# Patient Record
Sex: Female | Born: 1995 | Race: Black or African American | Hispanic: No | Marital: Single | Smoking: Never smoker
Health system: Southern US, Community
[De-identification: ages and names within clinical notes are randomized; demographics above are authoritative.]

## PROBLEM LIST (undated history)

## (undated) DIAGNOSIS — J36 Peritonsillar abscess: Secondary | ICD-10-CM

## (undated) DIAGNOSIS — L0501 Pilonidal cyst with abscess: Secondary | ICD-10-CM

## (undated) DIAGNOSIS — F329 Major depressive disorder, single episode, unspecified: Secondary | ICD-10-CM

## (undated) HISTORY — DX: Pilonidal cyst with abscess: L05.01

---
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
## 2017-02-18 ENCOUNTER — Emergency Department
Admission: EM | Admit: 2017-02-18 | Discharge: 2017-02-18 | Disposition: A | Discharge status: Home / Self Care | Payer: Medicaid Other | Attending: Emergency Medicine | Admitting: Emergency Medicine

## 2017-02-18 ENCOUNTER — Encounter: Payer: Self-pay | Admitting: Emergency Medicine

## 2017-02-18 DIAGNOSIS — Z79899 Other long term (current) drug therapy: Secondary | ICD-10-CM | POA: Diagnosis not present

## 2017-02-18 DIAGNOSIS — J029 Acute pharyngitis, unspecified: Secondary | ICD-10-CM | POA: Diagnosis not present

## 2017-02-18 LAB — POCT RAPID STREP A: STREPTOCOCCUS, GROUP A SCREEN (DIRECT): NEGATIVE

## 2017-02-18 MED ORDER — METHYLPREDNISOLONE SODIUM SUCC 125 MG IJ SOLR
125.0000 mg | Freq: Once | INTRAMUSCULAR | Status: AC
  Administered 2017-02-18: 125 mg via INTRAMUSCULAR
  Filled 2017-02-18: qty 2

## 2017-02-18 MED ORDER — DIPHENHYDRAMINE HCL 12.5 MG/5ML PO ELIX
25.0000 mg | ORAL_SOLUTION | Freq: Once | ORAL | Status: AC
Start: 2017-02-18 — End: 2017-02-18
  Administered 2017-02-18: 25 mg via ORAL
  Filled 2017-02-18: qty 10

## 2017-02-18 MED ORDER — MAGIC MOUTHWASH W/LIDOCAINE: 5.0000 mL | Freq: Four times a day (QID) | ORAL | 0 refills | Status: DC

## 2017-02-18 MED ORDER — TRAMADOL HCL 50 MG PO TABS
50.0000 mg | ORAL_TABLET | Freq: Once | ORAL | Status: AC
  Administered 2017-02-18: 50 mg via ORAL
  Filled 2017-02-18: qty 1

## 2017-02-18 MED ORDER — LIDOCAINE VISCOUS 2 % MT SOLN
15.0000 mL | Freq: Once | OROMUCOSAL | Status: AC
  Administered 2017-02-18: 15 mL via OROMUCOSAL
  Filled 2017-02-18: qty 15

## 2017-02-18 MED ORDER — IBUPROFEN 600 MG PO TABS: 600.0000 mg | ORAL_TABLET | Freq: Four times a day (QID) | ORAL | 0 refills | Status: DC | PRN

## 2017-02-18 MED ORDER — TRAMADOL HCL 50 MG PO TABS: 50.0000 mg | ORAL_TABLET | Freq: Four times a day (QID) | ORAL | 0 refills | Status: DC | PRN

## 2017-02-18 NOTE — ED Provider Notes (Signed)
[facility] Emergency Department Provider Note   ____________________________________________   First MD Initiated Contact with Patient 02/18/17 [phone]     (approximate)  I have reviewed the triage vital signs and the nursing notes.   HISTORY  Chief Complaint Sore Throat    HPI Suzanne Mathews is a 21 y.o. female patient complaining of sore throat for one day. Patient states swelling and pain is medial left side. Patient stated pain increases with swallowing. Patient denies fever at this complaint. Patient has past history of prior tonsillar abscess. No palliative measures for complaint. Patient rates the pain as 7/10. Patient described a pain as "sore".   Past Medical History:  Diagnosis Date  . Pilonidal cyst with abscess 2014    Patient Active Problem List   Diagnosis Date Noted  . Pilonidal cyst 09/13/2014  . Pilonidal cyst with abscess 07/30/2013    Past Surgical History:  Procedure Laterality Date  . PILONIDAL CYST DRAINAGE  07-28-13  . pilonidal cystectomy  08/02/14    Prior to Admission medications   Medication Sig Start Date End Date Taking? Authorizing Provider  cetirizine (ZYRTEC ALLERGY) 10 MG tablet Take 1 tablet (10 mg total) by mouth every morning. 11/20/11 01/09/13  Truddie Coco, DO  fluticasone (FLONASE) 50 MCG/ACT nasal spray Place 2 sprays into the nose daily. 11/20/11 01/09/13  Truddie Coco, DO  lansoprazole (PREVACID) 30 MG capsule Take 1 capsule (30 mg total) by mouth daily. 11/20/11 01/08/13  Truddie Coco, DO  sulfamethoxazole-trimethoprim (BACTRIM DS,SEPTRA DS) 800-160 MG per tablet  06/27/14   [provider]    Allergies Patient has no known allergies.  No family history on file.  Social History Social History  Substance Use Topics  . Smoking status: Never Smoker  . Smokeless tobacco: Never Used  . Alcohol use No    Review of Systems  Constitutional: No fever/chills ENT: sore throat. Cardiovascular:  Denies chest pain. Respiratory: Denies shortness of breath. Gastrointestinal: No abdominal pain.  No nausea, no vomiting.  No diarrhea.  No constipation. Genitourinary: Negative for dysuria. Musculoskeletal: Negative for back pain. Skin: Negative for rash. Neurological: Negative for headaches, focal weakness or numbness. ____________________________________________   PHYSICAL EXAM:  VITAL SIGNS: ED Triage Vitals  Enc Vitals Group     BP 02/18/17 0716 (!) 107/53     Pulse Rate 02/18/17 0716 80     Resp 02/18/17 0716 16     Temp 02/18/17 0716 98.1 F (36.7 C)     Temp Source 02/18/17 0716 Oral     SpO2 02/18/17 0716 100 %     Weight 02/18/17 0716 127 lb (57.6 kg)     Height 02/18/17 0716 5\' 2"  (1.575 m)     Head Circumference --      Peak Flow --      Pain Score 02/18/17 0722 7     Pain Loc --      Pain Edu? --      Excl. in GC? --     Constitutional: Alert and oriented. Well appearing and in no acute distress. Nose: No congestion/rhinnorhea. Mouth/Throat: Mucous membranes are moist.  Oropharynx non-erythematous. Mildly erythematous pharynx with no visible exudate. Neck: No stridor.  Hematological/Lymphatic/Immunilogical: No cervical lymphadenopathy. Cardiovascular: Normal rate, regular rhythm. Grossly normal heart sounds.  Good peripheral circulation. Respiratory: Normal respiratory effort.  No retractions. Lungs CTAB. Gastrointestinal: Soft and nontender. No distention. No abdominal bruits. No CVA tenderness. Musculoskeletal: No lower extremity tenderness nor edema.  No joint effusions.  Neurologic:  Normal speech and language. No gross focal neurologic deficits are appreciated. No gait instability. Skin:  Skin is warm, dry and intact. No rash noted. Psychiatric: Mood and affect are normal. Speech and behavior are normal.  ____________________________________________   LABS (all labs ordered are listed, but only abnormal results are displayed)  Labs Reviewed - No data  to display ____________________________________________  EKG   ____________________________________________  RADIOLOGY  No results found.  ____________________________________________   PROCEDURES  Procedure(s) performed: None  Procedures  Critical Care performed: No  ____________________________________________   INITIAL IMPRESSION / ASSESSMENT AND PLAN / ED COURSE  Pertinent labs & imaging results that were available during my care of the patient were reviewed by me and considered in my medical decision making (see chart for details).  Viral pharyngitis. Discussed negative rapid strep test. Patient advised cultures pending. Patient advised follow-up with PCP.      ____________________________________________   FINAL CLINICAL IMPRESSION(S) / ED DIAGNOSES  Final diagnoses:  None      NEW MEDICATIONS STARTED DURING THIS VISIT:  New Prescriptions   No medications on file     Note:  This document was prepared using Dragon voice recognition software and may include unintentional dictation errors.    Joni ReiningSmith, Alexi Dorminey K, PA-C 02/18/17 0815    Jeanmarie PlantMcShane, James A, MD 02/18/17 [phone]

## 2017-02-18 NOTE — ED Triage Notes (Signed)
Presents with sore throat  States pain and swelling mainly on left side of throat  Increased pain with swallowing  Denies any fever  Hx of peritonsillar abscess in past and feels the same

## 2017-02-18 NOTE — ED Notes (Signed)
Strep culture sent.

## 2017-02-20 LAB — CULTURE, GROUP A STREP (THRC)

## 2017-08-11 ENCOUNTER — Other Ambulatory Visit: Payer: Self-pay

## 2017-08-11 ENCOUNTER — Emergency Department
Admission: EM | Admit: 2017-08-11 | Discharge: 2017-08-11 | Disposition: A | Discharge status: Home / Self Care | Payer: Medicaid Other | Attending: Student in an Organized Health Care Education/Training Program | Admitting: Student in an Organized Health Care Education/Training Program

## 2017-08-11 DIAGNOSIS — Z79899 Other long term (current) drug therapy: Secondary | ICD-10-CM | POA: Diagnosis not present

## 2017-08-11 DIAGNOSIS — J029 Acute pharyngitis, unspecified: Secondary | ICD-10-CM | POA: Diagnosis present

## 2017-08-11 DIAGNOSIS — J020 Streptococcal pharyngitis: Secondary | ICD-10-CM | POA: Insufficient documentation

## 2017-08-11 LAB — GROUP A STREP BY PCR: Group A Strep by PCR: DETECTED — AB

## 2017-08-11 LAB — INFLUENZA PANEL BY PCR (TYPE A & B)
INFLAPCR: NEGATIVE
Influenza B By PCR: NEGATIVE

## 2017-08-11 MED ORDER — AMOXICILLIN 500 MG PO CAPS: 500.0000 mg | ORAL_CAPSULE | Freq: Three times a day (TID) | ORAL | 0 refills | Status: DC

## 2017-08-11 MED ORDER — ACETAMINOPHEN 325 MG PO TABS
650.0000 mg | ORAL_TABLET | Freq: Once | ORAL | Status: AC | PRN
  Administered 2017-08-11: 650 mg via ORAL
  Filled 2017-08-11: qty 2

## 2017-08-11 MED ORDER — KETOROLAC TROMETHAMINE 60 MG/2ML IM SOLN
60.0000 mg | Freq: Once | INTRAMUSCULAR | Status: AC
  Administered 2017-08-11: 60 mg via INTRAMUSCULAR
  Filled 2017-08-11: qty 2

## 2017-08-11 MED ORDER — IBUPROFEN 600 MG PO TABS: 600.0000 mg | ORAL_TABLET | Freq: Three times a day (TID) | ORAL | 0 refills | Status: DC | PRN

## 2017-08-11 NOTE — ED Triage Notes (Signed)
Pt c/o sore throat since yesterday with a fever and bodyaches today.

## 2017-08-11 NOTE — ED Provider Notes (Signed)
[facility] Emergency Department Provider Note   ____________________________________________   First MD Initiated Contact with Patient 08/11/17 [phone]     (approximate)  I have reviewed the triage vital signs and the nursing notes.   HISTORY  Chief Complaint Sore Throat    HPI Suzanne Mathews is a 21 y.o. female patient complaining of fever, sore throat and body aches which started yesterday. Patient denies nausea, vomiting, diarrhea. No palliative measures prior to arrival. Patient given Tylenol secondary to a temperature 102.7 in triage. Patient has not taken a flu shot this season.  Past Medical History:  Diagnosis Date  . Pilonidal cyst with abscess 2014    Patient Active Problem List   Diagnosis Date Noted  . Pilonidal cyst 09/13/2014  . Pilonidal cyst with abscess 07/30/2013    Past Surgical History:  Procedure Laterality Date  . PILONIDAL CYST DRAINAGE  07-28-13  . pilonidal cystectomy  08/02/14    Prior to Admission medications   Medication Sig Start Date End Date Taking? Authorizing Provider  amoxicillin (AMOXIL) 500 MG capsule Take 1 capsule (500 mg total) by mouth 3 (three) times daily. 08/11/17   Joni ReiningSmith, Keilen Kahl K, PA-C  cetirizine (ZYRTEC ALLERGY) 10 MG tablet Take 1 tablet (10 mg total) by mouth every morning. 11/20/11 01/09/13  Truddie CocoBush, Tamika, DO  fluticasone (FLONASE) 50 MCG/ACT nasal spray Place 2 sprays into the nose daily. 11/20/11 01/09/13  Truddie CocoBush, Tamika, DO  ibuprofen (ADVIL,MOTRIN) 600 MG tablet Take 1 tablet (600 mg total) by mouth every 6 (six) hours as needed. 02/18/17   Joni ReiningSmith, Fran Mcree K, PA-C  ibuprofen (ADVIL,MOTRIN) 600 MG tablet Take 1 tablet (600 mg total) by mouth every 8 (eight) hours as needed. 08/11/17   Joni ReiningSmith, Jesse Nosbisch K, PA-C  lansoprazole (PREVACID) 30 MG capsule Take 1 capsule (30 mg total) by mouth daily. 11/20/11 01/08/13  Truddie CocoBush, Tamika, DO  magic mouthwash w/lidocaine SOLN Take 5 mLs by mouth 4 (four) times daily. 02/18/17    Joni ReiningSmith, Homero Hyson K, PA-C  sulfamethoxazole-trimethoprim (BACTRIM DS,SEPTRA DS) 800-160 MG per tablet  06/27/14   [provider]  traMADol (ULTRAM) 50 MG tablet Take 1 tablet (50 mg total) by mouth every 6 (six) hours as needed for moderate pain. 02/18/17   Joni ReiningSmith, Shyteria Lewis K, PA-C    Allergies Patient has no known allergies.  No family history on file.  Social History Social History   Tobacco Use  . Smoking status: Never Smoker  . Smokeless tobacco: Never Used  Substance Use Topics  . Alcohol use: No  . Drug use: No    Review of Systems Constitutional: Fever/chills and body ache Eyes: No visual changes. ENT: Sore throat  Cardiovascular: Denies chest pain. Respiratory: Denies shortness of breath. Gastrointestinal: No abdominal pain.  No nausea, no vomiting.  No diarrhea.  No constipation. Genitourinary: Negative for dysuria. Musculoskeletal: Negative for back pain. Skin: Negative for rash. Neurological: Negative for headaches, focal weakness or numbness.   ____________________________________________   PHYSICAL EXAM:  VITAL SIGNS: ED Triage Vitals  Enc Vitals Group     BP 08/11/17 0855 (!) 107/54     Pulse Rate 08/11/17 0855 (!) 110     Resp 08/11/17 0855 17     Temp 08/11/17 0855 (!) 102.7 F (39.3 C)     Temp Source 08/11/17 0855 Oral     SpO2 08/11/17 0855 100 %     Weight 08/11/17 0856 127 lb (57.6 kg)     Height 08/11/17 0856 5\' 2"  (1.575  m)     Head Circumference --      Peak Flow --      Pain Score 08/11/17 0855 8     Pain Loc --      Pain Edu? --      Excl. in GC? --     Constitutional: Alert and oriented. Well appearing and in no acute distress. Febrile and malaise Nose: Edematous nasal terms clear rhinorrhea  Mouth/Throat: Mucous membranes are moist.  Oropharynx -erythematous. Postnasal drainage Neck: No stridor. Hematological/Lymphatic/Immunilogical: No cervical lymphadenopathy. Cardiovascular: Tachycardic regular rhythm. Grossly normal  heart sounds.  Good peripheral circulation. Respiratory: Normal respiratory effort.  No retractions. Lungs CTAB. Gastrointestinal: Soft and nontender. No distention. No abdominal bruits. No CVA tenderness. Neurologic:  Normal speech and language. No gross focal neurologic deficits are appreciated. No gait instability. Skin:  Skin is warm, dry and intact. No rash noted. Psychiatric: Mood and affect are normal. Speech and behavior are normal.  ____________________________________________   LABS (all labs ordered are listed, but only abnormal results are displayed)  Labs Reviewed  GROUP A STREP BY PCR - Abnormal; Notable for the following components:      Result Value   Group A Strep by PCR DETECTED (*)    All other components within normal limits  INFLUENZA PANEL BY PCR (TYPE A & B)   ____________________________________________  EKG   ____________________________________________  RADIOLOGY  No results found.  ____________________________________________   PROCEDURES  Procedure(s) performed: None  Procedures  Critical Care performed: No  ____________________________________________   INITIAL IMPRESSION / ASSESSMENT AND PLAN / ED COURSE  As part of my medical decision making, I reviewed the following data within the electronic MEDICAL RECORD NUMBER    Sore throat secondary to strep pharyngitis. Patient given discharge Instruction work no. Patient advised medication as directed. Patient advised follow-up with PCP if condition persists.      ____________________________________________   FINAL CLINICAL IMPRESSION(S) / ED DIAGNOSES  Final diagnoses:  Strep throat     ED Discharge Orders        Ordered    amoxicillin (AMOXIL) 500 MG capsule  3 times daily     08/11/17 1026    ibuprofen (ADVIL,MOTRIN) 600 MG tablet  Every 8 hours PRN     08/11/17 1026       Note:  This document was prepared using Dragon voice recognition software and may include  unintentional dictation errors.    Joni ReiningSmith, Detron Carras K, PA-C 08/11/17 1029    Willy Eddyobinson, Patrick, MD 08/11/17 [phone]

## 2017-08-11 NOTE — ED Notes (Signed)
See triage note.states last night she developed fever and body aches with sore throat..states her throat is not as sore at present  Denies any cough,n/v or headache

## 2017-10-15 ENCOUNTER — Encounter: Payer: Self-pay | Admitting: Emergency Medicine

## 2017-10-15 ENCOUNTER — Other Ambulatory Visit: Payer: Self-pay

## 2017-10-15 ENCOUNTER — Emergency Department
Admission: EM | Admit: 2017-10-15 | Discharge: 2017-10-15 | Disposition: A | Discharge status: Home / Self Care | Payer: Medicaid Other | Attending: Emergency Medicine | Admitting: Emergency Medicine

## 2017-10-15 DIAGNOSIS — M79601 Pain in right arm: Secondary | ICD-10-CM

## 2017-10-15 DIAGNOSIS — Z79899 Other long term (current) drug therapy: Secondary | ICD-10-CM | POA: Insufficient documentation

## 2017-10-15 DIAGNOSIS — M79621 Pain in right upper arm: Secondary | ICD-10-CM | POA: Diagnosis not present

## 2017-10-15 MED ORDER — PREDNISONE 50 MG PO TABS: ORAL_TABLET | ORAL | 0 refills | Status: DC

## 2017-10-15 NOTE — ED Notes (Signed)
Pt verbalized understanding of discharge instructions. NAD at this time. 

## 2017-10-15 NOTE — ED Triage Notes (Signed)
Pt in via POV with complaints of right upper arm pain x 3 days, denies any recent injury, no swelling noted at this time.  Pt ambulatory to triage, vitals WDL.

## 2017-10-15 NOTE — ED Provider Notes (Signed)
[facility] Emergency Department Provider Note  ____________________________________________  Time seen: Approximately 5:56 PM  I have reviewed the triage vital signs and the nursing notes.   HISTORY  Chief Complaint Arm Pain    HPI Suzanne Mathews is a 22 y.o. female presents to the emergency department with right upper arm pain for the past three days.  Patient reports that pain is exacerbated by abduction at the shoulder to 55 degrees.  Patient reports that pain feels sharp and hot.  Pain is not exacerbated with range of motion at the neck.  Pain radiates from upper extremity to hands.  She denies weakness or changes in sensation of the hand.  She denies falls or mechanisms of trauma.  Patient does engage in heavy lifting at work.  No alleviating measures have been attempted.   Past Medical History:  Diagnosis Date  . Pilonidal cyst with abscess 2014    Patient Active Problem List   Diagnosis Date Noted  . Pilonidal cyst 09/13/2014  . Pilonidal cyst with abscess 07/30/2013    Past Surgical History:  Procedure Laterality Date  . PILONIDAL CYST DRAINAGE  07-28-13  . pilonidal cystectomy  08/02/14    Prior to Admission medications   Medication Sig Start Date End Date Taking? Authorizing Provider  amoxicillin (AMOXIL) 500 MG capsule Take 1 capsule (500 mg total) by mouth 3 (three) times daily. 08/11/17   Joni Reining, PA-C  cetirizine (ZYRTEC ALLERGY) 10 MG tablet Take 1 tablet (10 mg total) by mouth every morning. 11/20/11 01/09/13  Truddie Coco, DO  fluticasone (FLONASE) 50 MCG/ACT nasal spray Place 2 sprays into the nose daily. 11/20/11 01/09/13  Truddie Coco, DO  ibuprofen (ADVIL,MOTRIN) 600 MG tablet Take 1 tablet (600 mg total) by mouth every 6 (six) hours as needed. 02/18/17   Joni Reining, PA-C  ibuprofen (ADVIL,MOTRIN) 600 MG tablet Take 1 tablet (600 mg total) by mouth every 8 (eight) hours as needed. 08/11/17   Joni Reining, PA-C   lansoprazole (PREVACID) 30 MG capsule Take 1 capsule (30 mg total) by mouth daily. 11/20/11 01/08/13  Truddie Coco, DO  magic mouthwash w/lidocaine SOLN Take 5 mLs by mouth 4 (four) times daily. 02/18/17   Joni Reining, PA-C  predniSONE (DELTASONE) 50 MG tablet Take one 50 mg tablet once daily for 5 days. 10/15/17   Orvil Feil, PA-C  sulfamethoxazole-trimethoprim (BACTRIM DS,SEPTRA DS) 800-160 MG per tablet  06/27/14   [provider]  traMADol (ULTRAM) 50 MG tablet Take 1 tablet (50 mg total) by mouth every 6 (six) hours as needed for moderate pain. 02/18/17   Joni Reining, PA-C    Allergies Patient has no known allergies.  No family history on file.  Social History Social History   Tobacco Use  . Smoking status: Never Smoker  . Smokeless tobacco: Never Used  Substance Use Topics  . Alcohol use: No  . Drug use: No     Review of Systems  Constitutional: No fever/chills Eyes: No visual changes. No discharge ENT: No upper respiratory complaints. Cardiovascular: no chest pain. Respiratory: no cough. No SOB. Gastrointestinal: No abdominal pain.  No nausea, no vomiting.  No diarrhea.  No constipation. Musculoskeletal: Patient has right upper arm pain.  Skin: Negative for rash, abrasions, lacerations, ecchymosis. Neurological: Negative for headaches, focal weakness or numbness.  ____________________________________________   PHYSICAL EXAM:  VITAL SIGNS: ED Triage Vitals  Enc Vitals Group     BP 10/15/17 1654 (!) 115/55  Pulse Rate 10/15/17 1654 62     Resp 10/15/17 1654 16     Temp 10/15/17 1654 98.2 F (36.8 C)     Temp Source 10/15/17 1654 Oral     SpO2 10/15/17 1654 98 %     Weight 10/15/17 1655 125 lb (56.7 kg)     Height 10/15/17 1655 5\' 2"  (1.575 m)     Head Circumference --      Peak Flow --      Pain Score 10/15/17 1655 0     Pain Loc --      Pain Edu? --      Excl. in GC? --      Constitutional: Alert and oriented. Well appearing and  in no acute distress. Eyes: Conjunctivae are normal. PERRL. EOMI. Head: Atraumatic. Cardiovascular: Normal rate, regular rhythm. Normal S1 and S2.  Good peripheral circulation. Respiratory: Normal respiratory effort without tachypnea or retractions. Lungs CTAB. Good air entry to the bases with no decreased or absent breath sounds. Musculoskeletal: Pain is reproduced with abduction at the right shoulder to 55 degrees.  Patient has no weakness of the right  rotator cuff.  She performs full range of motion of the right wrist, right elbow and right shoulder.  No radiculopathy is elicited with range of motion at the neck. Neurologic:  Normal speech and language. No gross focal neurologic deficits are appreciated.  Skin:  Skin is warm, dry and intact. No rash noted.   ____________________________________________   LABS (all labs ordered are listed, but only abnormal results are displayed)  Labs Reviewed - No data to display ____________________________________________  EKG   ____________________________________________  RADIOLOGY   No results found.  ____________________________________________    PROCEDURES  Procedure(s) performed:    Procedures    Medications - No data to display   ____________________________________________   INITIAL IMPRESSION / ASSESSMENT AND PLAN / ED COURSE  Pertinent labs & imaging results that were available during my care of the patient were reviewed by me and considered in my medical decision making (see chart for details).  Review of the [place] CSRS was performed in accordance of the [facility] prior to dispensing any controlled drugs.     Assessment and plan Right upper extremity pain Patient presents to the emergency department with right upper extremity pain for the past 3 days.  Differential diagnosis includes radiculopathy, muscle spasm and tendinitis.  Patient was treated empirically with prednisone and advised to abstain from heavy  lifting at work for approximately 1 week.  She was referred to orthopedics.  All patient questions were answered.    ____________________________________________  FINAL CLINICAL IMPRESSION(S) / ED DIAGNOSES  Final diagnoses:  Right arm pain      NEW MEDICATIONS STARTED DURING THIS VISIT:  ED Discharge Orders        Ordered    predniSONE (DELTASONE) 50 MG tablet     10/15/17 1752          This chart was dictated using voice recognition software/Dragon. Despite best efforts to proofread, errors can occur which can change the meaning. Any change was purely unintentional.    Orvil FeilWoods, Jaclyn M, PA-C 10/15/17 1800    Jeanmarie PlantMcShane, James A, MD 10/15/17 2053

## 2017-11-17 ENCOUNTER — Encounter: Payer: Self-pay | Admitting: Emergency Medicine

## 2017-11-17 ENCOUNTER — Emergency Department: Payer: Medicaid Other

## 2017-11-17 ENCOUNTER — Emergency Department
Admission: EM | Admit: 2017-11-17 | Discharge: 2017-11-18 | Disposition: A | Discharge status: Home / Self Care | Payer: Medicaid Other | Attending: Emergency Medicine | Admitting: Emergency Medicine

## 2017-11-17 ENCOUNTER — Other Ambulatory Visit: Payer: Self-pay

## 2017-11-17 DIAGNOSIS — J039 Acute tonsillitis, unspecified: Secondary | ICD-10-CM

## 2017-11-17 DIAGNOSIS — J36 Peritonsillar abscess: Secondary | ICD-10-CM

## 2017-11-17 DIAGNOSIS — Z79899 Other long term (current) drug therapy: Secondary | ICD-10-CM | POA: Diagnosis not present

## 2017-11-17 DIAGNOSIS — J020 Streptococcal pharyngitis: Secondary | ICD-10-CM

## 2017-11-17 DIAGNOSIS — J029 Acute pharyngitis, unspecified: Secondary | ICD-10-CM | POA: Diagnosis present

## 2017-11-17 LAB — COMPREHENSIVE METABOLIC PANEL
ALT: 10 U/L — ABNORMAL LOW (ref 14–54)
ANION GAP: 5 (ref 5–15)
AST: 16 U/L (ref 15–41)
Albumin: 3.6 g/dL (ref 3.5–5.0)
Alkaline Phosphatase: 51 U/L (ref 38–126)
BILIRUBIN TOTAL: 0.6 mg/dL (ref 0.3–1.2)
BUN: 9 mg/dL (ref 6–20)
CALCIUM: 8.4 mg/dL — AB (ref 8.9–10.3)
CO2: 26 mmol/L (ref 22–32)
Chloride: 104 mmol/L (ref 101–111)
Creatinine, Ser: 0.63 mg/dL (ref 0.44–1.00)
GFR calc non Af Amer: >60 mL/min (ref >60)
GLUCOSE: 100 mg/dL — AB (ref 65–99)
Potassium: 3.5 mmol/L (ref 3.5–5.1)
Sodium: 135 mmol/L (ref 135–145)
TOTAL PROTEIN: 7.7 g/dL (ref 6.5–8.1)

## 2017-11-17 LAB — CBC WITH DIFFERENTIAL/PLATELET
Basophils Absolute: 0 10*3/uL (ref 0–0.1)
Basophils Relative: 0 %
Eosinophils Absolute: 0.2 10*3/uL (ref 0–0.7)
Eosinophils Relative: 2 %
HEMATOCRIT: 32.4 % — AB (ref 35.0–47.0)
Hemoglobin: 10 g/dL — ABNORMAL LOW (ref 12.0–16.0)
LYMPHS ABS: 1.5 10*3/uL (ref 1.0–3.6)
Lymphocytes Relative: 19 %
MCH: 22.6 pg — AB (ref 26.0–34.0)
MCHC: 31 g/dL — AB (ref 32.0–36.0)
MCV: 72.8 fL — AB (ref 80.0–100.0)
MONOS PCT: 8 %
Monocytes Absolute: 0.6 10*3/uL (ref 0.2–0.9)
NEUTROS ABS: 5.7 10*3/uL (ref 1.4–6.5)
NEUTROS PCT: 71 %
Platelets: 258 10*3/uL (ref 150–440)
RBC: 4.45 MIL/uL (ref 3.80–5.20)
RDW: 16.1 % — ABNORMAL HIGH (ref 11.5–14.5)
WBC: 8.1 10*3/uL (ref 3.6–11.0)

## 2017-11-17 MED ORDER — IOPAMIDOL (ISOVUE-300) INJECTION 61%
75.0000 mL | Freq: Once | INTRAVENOUS | Status: AC | PRN
  Administered 2017-11-17: 75 mL via INTRAVENOUS

## 2017-11-17 MED ORDER — DEXAMETHASONE SODIUM PHOSPHATE 10 MG/ML IJ SOLN
10.0000 mg | Freq: Once | INTRAMUSCULAR | Status: AC
  Administered 2017-11-17: 10 mg via INTRAVENOUS
  Filled 2017-11-17: qty 1

## 2017-11-17 NOTE — ED Triage Notes (Signed)
Pt arrived to the ED for complaints of left tonsill pain and swelling. Pt reports that she had tonsillar abscesses in the past that needed to be drained and this feels just like it. Pt is AOx4 in moderate pain with +3 left tonsill swelling.

## 2017-11-17 NOTE — ED Notes (Signed)
Pt states that over the weekend she started with a sore throat that has progressively gotten worse to the point that she is having difficulty eating and swallowing - pt states she had strep in Dec and had antibiotics left over that she has been taking for the last 2 days

## 2017-11-17 NOTE — ED Provider Notes (Signed)
[facility] Emergency Department Provider Note   ____________________________________________   First MD Initiated Contact with Patient 11/17/17 2314     (approximate)  I have reviewed the triage vital signs and the nursing notes.   HISTORY  Chief Complaint Oral Swelling and Sore Throat    HPI ATLAS KUC is a 22 y.o. female who comes into the hospital today stating that she has an abscess on her left tonsil.  The patient states that she noticed it today.  She knew she had it but when she woke up today it was getting worse.  The patient had some sore throat that started this weekend.  She reports that when she woke up she knew it had turned into an abscess.  The patient had a similar episode 2 years ago where it was drained with needles and she follow-up with ENT but she does not remember who she saw.  The patient has not had any fevers nausea or vomiting.  She rates her pain a 10 out of 10 in intensity and has not taken anything for pain.  It is very painful to swallow and the patient is spitting into a bag.  The patient states that she had strep in December and took 1 of her leftover antibiotics but it did not help.  The patient is here today for evaluation.   Past Medical History:  Diagnosis Date  . Pilonidal cyst with abscess 2014    Patient Active Problem List   Diagnosis Date Noted  . Pilonidal cyst 09/13/2014  . Pilonidal cyst with abscess 07/30/2013    Past Surgical History:  Procedure Laterality Date  . PILONIDAL CYST DRAINAGE  07-28-13  . pilonidal cystectomy  08/02/14    Prior to Admission medications   Medication Sig Start Date End Date Taking? Authorizing Provider  amoxicillin (AMOXIL) 500 MG capsule Take 1 capsule (500 mg total) by mouth 3 (three) times daily. Patient not taking: Reported on 11/18/2017 08/11/17   Joni Reining, PA-C  amoxicillin-clavulanate (AUGMENTIN) 875-125 MG tablet Take 1 tablet by mouth 2 (two) times daily  for 10 days. 11/18/17 11/28/17  Rebecka Apley, MD  cetirizine (ZYRTEC ALLERGY) 10 MG tablet Take 1 tablet (10 mg total) by mouth every morning. 11/20/11 01/09/13  Truddie Coco, DO  fluticasone (FLONASE) 50 MCG/ACT nasal spray Place 2 sprays into the nose daily. 11/20/11 01/09/13  Truddie Coco, DO  ibuprofen (ADVIL,MOTRIN) 600 MG tablet Take 1 tablet (600 mg total) by mouth every 6 (six) hours as needed. Patient not taking: Reported on 11/18/2017 02/18/17   Joni Reining, PA-C  ibuprofen (ADVIL,MOTRIN) 600 MG tablet Take 1 tablet (600 mg total) by mouth every 8 (eight) hours as needed. Patient not taking: Reported on 11/18/2017 08/11/17   Joni Reining, PA-C  lansoprazole (PREVACID) 30 MG capsule Take 1 capsule (30 mg total) by mouth daily. 11/20/11 01/08/13  Truddie Coco, DO  magic mouthwash w/lidocaine SOLN Take 5 mLs by mouth 4 (four) times daily. Patient not taking: Reported on 11/18/2017 02/18/17   Joni Reining, PA-C  oxyCODONE (ROXICODONE) 5 MG/5ML solution Take 5 mLs (5 mg total) by mouth every 4 (four) hours as needed for severe pain. 11/18/17   Rebecka Apley, MD  predniSONE (DELTASONE) 50 MG tablet Take one 50 mg tablet once daily for 5 days. Patient not taking: Reported on 11/18/2017 10/15/17   Orvil Feil, PA-C  predniSONE (STERAPRED UNI-PAK 21 TAB) 10 MG (21) TBPK tablet Take 6  tabs on day 1 Take 5 tabs on day 2 Take 4 tabs on day 3 Take 3 tabs on day 4 Take 2 tabs on day 5 Take 1 tab on day 6 11/18/17   Rebecka Apley, MD  traMADol (ULTRAM) 50 MG tablet Take 1 tablet (50 mg total) by mouth every 6 (six) hours as needed for moderate pain. Patient not taking: Reported on 11/18/2017 02/18/17   Joni Reining, PA-C    Allergies Patient has no known allergies.  History reviewed. No pertinent family history.  Social History Social History   Tobacco Use  . Smoking status: Never Smoker  . Smokeless tobacco: Never Used  Substance Use Topics  . Alcohol use: No  . Drug use: No     Review of Systems  Constitutional: No fever/chills Eyes: No visual changes. ENT:  sore throat. Cardiovascular: Denies chest pain. Respiratory: Denies shortness of breath. Gastrointestinal: No abdominal pain.  No nausea, no vomiting.   Genitourinary: Negative for dysuria. Musculoskeletal: Negative for back pain. Skin: Negative for rash. Neurological: Negative for headaches   ____________________________________________   PHYSICAL EXAM:  VITAL SIGNS: ED Triage Vitals  Enc Vitals Group     BP 11/17/17 2156 (!) 108/59     Pulse --      Resp 11/17/17 2156 16     Temp 11/17/17 2156 99 F (37.2 C)     Temp Source 11/17/17 2156 Oral     SpO2 11/17/17 2156 100 %     Weight 11/17/17 2157 125 lb (56.7 kg)     Height 11/17/17 2157 5\' 2"  (1.575 m)     Head Circumference --      Peak Flow --      Pain Score 11/17/17 2156 10     Pain Loc --      Pain Edu? --      Excl. in GC? --     Constitutional: Alert and oriented. Well appearing and in moderate distress. Eyes: Conjunctivae are normal. PERRL. EOMI. Head: Atraumatic. Nose: No congestion/rhinnorhea. Mouth/Throat: Mucous membranes are moist.  Oropharynx mildly erythematous with some left sided tonsillar swelliing Neck: No stridor.   Hematological/Lymphatic/Immunilogical: cervical lymphadenopathy. Cardiovascular: Normal rate, regular rhythm. Grossly normal heart sounds.  Good peripheral circulation. Respiratory: Normal respiratory effort.  No retractions. Lungs CTAB. Gastrointestinal: Soft and nontender. No distention. Positive bowel sounds Musculoskeletal: No lower extremity tenderness nor edema.   Neurologic:  Normal speech and language.  Skin:  Skin is warm, dry and intact.  Psychiatric: Mood and affect are normal.   ____________________________________________   LABS (all labs ordered are listed, but only abnormal results are displayed)  Labs Reviewed  GROUP A STREP BY PCR - Abnormal; Notable for the following  components:      Result Value   Group A Strep by PCR DETECTED (*)    All other components within normal limits  CBC WITH DIFFERENTIAL/PLATELET - Abnormal; Notable for the following components:   Hemoglobin 10.0 (*)    HCT 32.4 (*)    MCV 72.8 (*)    MCH 22.6 (*)    MCHC 31.0 (*)    RDW 16.1 (*)    All other components within normal limits  COMPREHENSIVE METABOLIC PANEL - Abnormal; Notable for the following components:   Glucose, Bld 100 (*)    Calcium 8.4 (*)    ALT 10 (*)    All other components within normal limits   ____________________________________________  EKG  none patient has a skin pain in  her left ____________________________________________  RADIOLOGY  ED MD interpretation:  CT soft tissue neck: Findings consistent with acute tonsillitis with left-sided pharyngitis, superimposed 10 x 7 x 10 mm left tonsillar/peritonsillar abscess, mildly enlarged bilateral adenopathy.  Official radiology report(s): Ct Soft Tissue Neck W Contrast  Result Date: 11/18/2017 CLINICAL DATA:  Initial evaluation for acute sore throat, stridor. EXAM: CT NECK WITH CONTRAST TECHNIQUE: Multidetector CT imaging of the neck was performed using the standard protocol following the bolus administration of intravenous contrast. CONTRAST:  75mL ISOVUE-300 IOPAMIDOL (ISOVUE-300) INJECTION 61% COMPARISON:  None. FINDINGS: Pharynx and larynx: Oral cavity within normal limits without mass lesion or loculated fluid collection. No acute abnormality seen about the dentition. [place] tonsils are enlarged and hyperenhancing bilaterally, consistent with acute tonsillitis. Tonsils abut at the midline. On the left, there is a superimposed hypodense collection measuring 10 x 7 x 10 mm, consistent with a superimposed tonsillar/peritonsillar abscess (series 3, image 21). No discrete right-sided collections identified. Induration of the parapharyngeal fat bilaterally, worse on the left. Nasopharynx within normal limits.  Associated mucosal edema extends into the left oropharynx and inferiorly into the left hypopharynx, consistent with associated pharyngitis. No discrete retropharyngeal fluid collection. Epiglottis itself is within normal limits without evidence for acute epiglottitis. Remainder of the hypopharynx and supraglottic larynx is normal. True cords normal. Subglottic airway clear. Salivary glands: Salivary glands including the parotid and submandibular glands are within normal limits. Thyroid: Normal. Lymph nodes: Mildly enlarged bilateral level II lymph nodes measure up to 11 mm on the left and 13 mm on the right, likely reactive. No other significant adenopathy within the neck. Vascular: Normal intravascular enhancement seen throughout the neck. Limited intracranial: Unremarkable. Visualized orbits: Unremarkable. Mastoids and visualized paranasal sinuses: Mild mucosal thickening within the maxillary sinuses bilaterally. Visualized paranasal sinuses are otherwise largely clear. Bilateral concha bullosa with mild right-to-left nasal septal deviation noted. Partially visualized mastoids are clear. Skeleton: No acute osseous abnormality. No worrisome lytic or blastic osseous lesions. Upper chest: Visualized upper chest within normal limits. Partially visualized lungs are clear. Other: None. IMPRESSION: 1. Findings consistent with acute tonsillitis with left-sided pharyngitis. Superimposed 10 x 7 x 10 mm left tonsillar/peritonsillar abscess as above. 2. Mildly enlarged bilateral level II adenopathy, likely reactive. Electronically Signed   By: Rise Mu M.D.   On: 11/18/2017 00:30    ____________________________________________   PROCEDURES  Procedure(s) performed: None  Procedures  Critical Care performed: No  ____________________________________________   INITIAL IMPRESSION / ASSESSMENT AND PLAN / ED COURSE  As part of my medical decision making, I reviewed the following data within the  electronic MEDICAL RECORD NUMBER Notes from prior ED visits and [place] Controlled Substance Database   This is a 22 year old female who comes into the hospital today stating that she has an abscess on her left tonsil.  My differential diagnosis includes strep throat, peritonsillar abscess, tonsillitis.  The patient did have some blood work drawn which was unremarkable.  The patient's white count is 8.1 and her CMP is negative.  I will send the patient for a soft tissue neck looking for tonsillar abscess.  I did give her a dose of Decadron.  She will be reassessed.    The patient did receive a dose of Decadron when she initially arrived.  Once I discovered that the patient had tonsillitis and positive strep I did order an Augmentin for her as well as some oxycodone.  The patient was sleeping comfortably with no complaints.  Her abscess is 1 cm  x 1 cm and too small to drain.  I discussed this with the patient but explained to her that should the swelling get any bigger or she have any worsening pain or any other concerns she should return.  Also the patient does need to follow-up with ENT as she has had multiple episodes of strep and may need a tonsillectomy.  The patient will be discharged home.  She understands the plans as stated.  ____________________________________________   FINAL CLINICAL IMPRESSION(S) / ED DIAGNOSES  Final diagnoses:  Tonsillitis  Strep throat  Tonsillar abscess     ED Discharge Orders        Ordered    amoxicillin-clavulanate (AUGMENTIN) 875-125 MG tablet  2 times daily     11/18/17 0120    predniSONE (STERAPRED UNI-PAK 21 TAB) 10 MG (21) TBPK tablet     11/18/17 0120    oxyCODONE (ROXICODONE) 5 MG/5ML solution  Every 4 hours PRN     11/18/17 0120       Note:  This document was prepared using Dragon voice recognition software and may include unintentional dictation errors.    Rebecka ApleyWebster, Allison P, MD 11/18/17 [phone]

## 2017-11-18 LAB — GROUP A STREP BY PCR: Group A Strep by PCR: DETECTED — AB

## 2017-11-18 MED ORDER — OXYCODONE HCL 5 MG/5ML PO SOLN
5.0000 mg | Freq: Once | ORAL | Status: AC
  Administered 2017-11-18: 5 mg via ORAL
  Filled 2017-11-18: qty 5

## 2017-11-18 MED ORDER — AMOXICILLIN-POT CLAVULANATE 875-125 MG PO TABS: 1.0000 | ORAL_TABLET | Freq: Two times a day (BID) | ORAL | 0 refills | Status: AC

## 2017-11-18 MED ORDER — AMOXICILLIN-POT CLAVULANATE 875-125 MG PO TABS
1.0000 | ORAL_TABLET | Freq: Once | ORAL | Status: AC
  Administered 2017-11-18: 1 via ORAL
  Filled 2017-11-18: qty 1

## 2017-11-18 MED ORDER — OXYCODONE HCL 5 MG/5ML PO SOLN: 5.0000 mg | ORAL | 0 refills | Status: DC | PRN

## 2017-11-18 MED ORDER — PREDNISONE 10 MG (21) PO TBPK: ORAL_TABLET | ORAL | 0 refills | Status: DC

## 2017-11-18 NOTE — Discharge Instructions (Signed)
Please follow-up with ENT for further evaluation.  Given your frequent episodes of strep and tonsillitis you may need to get her tonsils removed.  Please return with any worsening shortness of breath worsening swelling or any other concerns.

## 2018-10-28 ENCOUNTER — Encounter: Payer: Self-pay | Admitting: Emergency Medicine

## 2018-10-28 ENCOUNTER — Emergency Department
Admission: EM | Admit: 2018-10-28 | Discharge: 2018-10-28 | Disposition: A | Discharge status: Home / Self Care | Payer: Medicaid Other | Attending: Emergency Medicine | Admitting: Emergency Medicine

## 2018-10-28 ENCOUNTER — Other Ambulatory Visit: Payer: Self-pay

## 2018-10-28 DIAGNOSIS — R6889 Other general symptoms and signs: Secondary | ICD-10-CM

## 2018-10-28 DIAGNOSIS — Z79899 Other long term (current) drug therapy: Secondary | ICD-10-CM | POA: Insufficient documentation

## 2018-10-28 DIAGNOSIS — J111 Influenza due to unidentified influenza virus with other respiratory manifestations: Secondary | ICD-10-CM | POA: Insufficient documentation

## 2018-10-28 LAB — INFLUENZA PANEL BY PCR (TYPE A & B)
INFLAPCR: NEGATIVE
INFLBPCR: NEGATIVE

## 2018-10-28 LAB — GROUP A STREP BY PCR: Group A Strep by PCR: NOT DETECTED

## 2018-10-28 MED ORDER — IBUPROFEN 600 MG PO TABS: 600.0000 mg | ORAL_TABLET | Freq: Three times a day (TID) | ORAL | 0 refills | Status: DC | PRN

## 2018-10-28 MED ORDER — LIDOCAINE VISCOUS HCL 2 % MT SOLN: 5.0000 mL | Freq: Four times a day (QID) | OROMUCOSAL | 0 refills | Status: DC | PRN

## 2018-10-28 MED ORDER — PSEUDOEPH-BROMPHEN-DM 30-2-10 MG/5ML PO SYRP: 5.0000 mL | ORAL_SOLUTION | Freq: Four times a day (QID) | ORAL | 0 refills | Status: DC | PRN

## 2018-10-28 NOTE — ED Triage Notes (Signed)
PT arrives with complaints of sore throat and generalized body aches that started when pt woke up this morning.

## 2018-10-28 NOTE — ED Notes (Signed)
Pt c/o sore throat and generalized body aches that started yesterday. Pt with swollen tonsils on assessment, c/o painful swallowing at this time. Pt denies having had her flu shot. Also c/o being "achy and sore". Pt is alert and oriented at this time, skin warm, dry, and intact, respirations even and unlabored.

## 2018-10-28 NOTE — ED Provider Notes (Signed)
[facility] Emergency Department Provider Note   ____________________________________________   First MD Initiated Contact with Patient 10/28/18 1211     (approximate)  I have reviewed the triage vital signs and the nursing notes.   HISTORY  Chief Complaint Sore Throat    HPI Suzanne Mathews is a 23 y.o. female patient complaining of generalized body aches which began yesterday.  Patient awakened this morning with generalized body aches.  Patient state nasal congestion but no coughing.  Patient denies nausea, vomiting, diarrhea.  Patient rates her pain discomfort as 8/10.  Patient describes her pain as "sore".  Patient is not taking a flu shot for this season.  No palliative measure for complaint.  Patient denies recent travel or contact with anyone who has recent travel.         Past Medical History:  Diagnosis Date  . Pilonidal cyst with abscess 2014    Patient Active Problem List   Diagnosis Date Noted  . Pilonidal cyst 09/13/2014  . Pilonidal cyst with abscess 07/30/2013    Past Surgical History:  Procedure Laterality Date  . PILONIDAL CYST DRAINAGE  07-28-13  . pilonidal cystectomy  08/02/14    Prior to Admission medications   Medication Sig Start Date End Date Taking? Authorizing Provider  amoxicillin (AMOXIL) 500 MG capsule Take 1 capsule (500 mg total) by mouth 3 (three) times daily. Patient not taking: Reported on 11/18/2017 08/11/17   Joni Reining, PA-C  brompheniramine-pseudoephedrine-DM 30-2-10 MG/5ML syrup Take 5 mLs by mouth 4 (four) times daily as needed. Mix with 5 mL of viscous lidocaine for swish and swallow. 10/28/18   Joni Reining, PA-C  cetirizine (ZYRTEC ALLERGY) 10 MG tablet Take 1 tablet (10 mg total) by mouth every morning. 11/20/11 01/09/13  Truddie Coco, DO  fluticasone (FLONASE) 50 MCG/ACT nasal spray Place 2 sprays into the nose daily. 11/20/11 01/09/13  Truddie Coco, DO  ibuprofen (ADVIL,MOTRIN) 600 MG tablet  Take 1 tablet (600 mg total) by mouth every 6 (six) hours as needed. Patient not taking: Reported on 11/18/2017 02/18/17   Joni Reining, PA-C  ibuprofen (ADVIL,MOTRIN) 600 MG tablet Take 1 tablet (600 mg total) by mouth every 8 (eight) hours as needed. Patient not taking: Reported on 11/18/2017 08/11/17   Joni Reining, PA-C  ibuprofen (ADVIL,MOTRIN) 600 MG tablet Take 1 tablet (600 mg total) by mouth every 8 (eight) hours as needed. 10/28/18   Joni Reining, PA-C  lansoprazole (PREVACID) 30 MG capsule Take 1 capsule (30 mg total) by mouth daily. 11/20/11 01/08/13  Truddie Coco, DO  lidocaine (XYLOCAINE) 2 % solution Use as directed 5 mLs in the mouth or throat every 6 (six) hours as needed for mouth pain. Mix with 5 mL of Bromfed-DM for swish and swallow. 10/28/18   Joni Reining, PA-C  magic mouthwash w/lidocaine SOLN Take 5 mLs by mouth 4 (four) times daily. Patient not taking: Reported on 11/18/2017 02/18/17   Joni Reining, PA-C  oxyCODONE (ROXICODONE) 5 MG/5ML solution Take 5 mLs (5 mg total) by mouth every 4 (four) hours as needed for severe pain. 11/18/17   Rebecka Apley, MD  predniSONE (DELTASONE) 50 MG tablet Take one 50 mg tablet once daily for 5 days. Patient not taking: Reported on 11/18/2017 10/15/17   Orvil Feil, PA-C  predniSONE (STERAPRED UNI-PAK 21 TAB) 10 MG (21) TBPK tablet Take 6 tabs on day 1 Take 5 tabs on day 2 Take 4 tabs on  day 3 Take 3 tabs on day 4 Take 2 tabs on day 5 Take 1 tab on day 6 11/18/17   Rebecka Apley, MD  traMADol (ULTRAM) 50 MG tablet Take 1 tablet (50 mg total) by mouth every 6 (six) hours as needed for moderate pain. Patient not taking: Reported on 11/18/2017 02/18/17   Joni Reining, PA-C    Allergies Patient has no known allergies.  No family history on file.  Social History Social History   Tobacco Use  . Smoking status: Never Smoker  . Smokeless tobacco: Never Used  Substance Use Topics  . Alcohol use: No  . Drug use: No     Review of Systems Constitutional: No fever/chills.  Body aches Eyes: No visual changes. ENT: Sore throat. Cardiovascular: Denies chest pain. Respiratory: Denies shortness of breath. Gastrointestinal: No abdominal pain.  No nausea, no vomiting.  No diarrhea.  No constipation. Genitourinary: Negative for dysuria. Musculoskeletal: Negative for back pain. Skin: Negative for rash. Neurological: Negative for headaches, focal weakness or numbness.   ____________________________________________   PHYSICAL EXAM:  VITAL SIGNS: ED Triage Vitals [10/28/18 1200]  Enc Vitals Group     BP 102/61     Pulse Rate (!) 108     Resp 18     Temp 98.6 F (37 C)     Temp Source Oral     SpO2 100 %     Weight      Height  (1.549 m)     Head Circumference      Peak Flow      Pain Score 8     Pain Loc      Pain Edu?      Excl. in GC?     Constitutional: Alert and oriented. Well appearing and in no acute distress. Nose: Nasal congestion.   Mouth/Throat: Mucous membranes are moist.  Oropharynx erythematous.  Postnasal drainage. Neck: No stridor.   Hematological/Lymphatic/Immunilogical: No cervical lymphadenopathy. Cardiovascular: Tachycardic , regular rhythm. Grossly normal heart sounds.  Good peripheral circulation. Respiratory: Normal respiratory effort.  No retractions. Lungs CTAB. Gastrointestinal: Soft and nontender. No distention. No abdominal bruits. No CVA tenderness. Skin:  Skin is warm, dry and intact. No rash noted. Psychiatric: Mood and affect are normal. Speech and behavior are normal.  ____________________________________________   LABS (all labs ordered are listed, but only abnormal results are displayed)  Labs Reviewed  GROUP A STREP BY PCR  INFLUENZA PANEL BY PCR (TYPE A & B)   ____________________________________________  EKG   ____________________________________________  RADIOLOGY  ED MD interpretation:    Official radiology report(s): No results  found.  ____________________________________________   PROCEDURES  Procedure(s) performed (including Critical Care):  Procedures   ____________________________________________   INITIAL IMPRESSION / ASSESSMENT AND PLAN / ED COURSE  As part of my medical decision making, I reviewed the following data within the electronic MEDICAL RECORD NUMBER         Patient presents with sore throat and body aches that began this morning.  Patient tested negative for influenza and strep pharyngitis.  Patient given discharge care instructions for viral illness.  Patient advised take medication directed [facility].     ____________________________________________   FINAL CLINICAL IMPRESSION(S) / ED DIAGNOSES  Final diagnoses:  Flu-like symptoms     ED Discharge Orders         Ordered    brompheniramine-pseudoephedrine-DM 30-2-10 MG/5ML syrup  4 times daily PRN     10/28/18  1326    lidocaine (XYLOCAINE) 2 % solution  Every 6 hours PRN     10/28/18 1326    ibuprofen (ADVIL,MOTRIN) 600 MG tablet  Every 8 hours PRN     10/28/18 1326           Note:  This document was prepared using Dragon voice recognition software and may include unintentional dictation errors.    Joni Reining, PA-C 10/28/18 1329    Jene Every, MD 10/28/18 [phone]

## 2018-11-01 ENCOUNTER — Emergency Department: Payer: Medicaid Other

## 2018-11-01 ENCOUNTER — Inpatient Hospital Stay
Admission: EM | Admit: 2018-11-01 | Discharge: 2018-11-02 | DRG: 153 | Disposition: A | Discharge status: Home / Self Care | Payer: Medicaid Other | Attending: Internal Medicine | Admitting: Internal Medicine

## 2018-11-01 ENCOUNTER — Other Ambulatory Visit: Payer: Self-pay

## 2018-11-01 DIAGNOSIS — R061 Stridor: Secondary | ICD-10-CM | POA: Diagnosis present

## 2018-11-01 DIAGNOSIS — J36 Peritonsillar abscess: Principal | ICD-10-CM | POA: Diagnosis present

## 2018-11-01 HISTORY — DX: Peritonsillar abscess: J36

## 2018-11-01 LAB — COMPREHENSIVE METABOLIC PANEL
ALBUMIN: 3.7 g/dL (ref 3.5–5.0)
ALT: 11 U/L (ref 0–44)
AST: 16 U/L (ref 15–41)
Alkaline Phosphatase: 45 U/L (ref 38–126)
Anion gap: 9 (ref 5–15)
BUN: 8 mg/dL (ref 6–20)
CHLORIDE: 108 mmol/L (ref 98–111)
CO2: 24 mmol/L (ref 22–32)
CREATININE: 0.64 mg/dL (ref 0.44–1.00)
Calcium: 8.3 mg/dL — ABNORMAL LOW (ref 8.9–10.3)
GFR calc Af Amer: >60 mL/min (ref >60)
GLUCOSE: 97 mg/dL (ref 70–99)
Potassium: 3.5 mmol/L (ref 3.5–5.1)
SODIUM: 141 mmol/L (ref 135–145)
Total Bilirubin: 0.3 mg/dL (ref 0.3–1.2)
Total Protein: 7.2 g/dL (ref 6.5–8.1)

## 2018-11-01 LAB — CBC
HCT: 30.6 % — ABNORMAL LOW (ref 36.0–46.0)
Hemoglobin: 9.6 g/dL — ABNORMAL LOW (ref 12.0–15.0)
MCH: 24.3 pg — ABNORMAL LOW (ref 26.0–34.0)
MCHC: 31.4 g/dL (ref 30.0–36.0)
MCV: 77.5 fL — AB (ref 80.0–100.0)
NRBC: 0 % (ref 0.0–0.2)
Platelets: 235 10*3/uL (ref 150–400)
RBC: 3.95 MIL/uL (ref 3.87–5.11)
RDW: 14.3 % (ref 11.5–15.5)
WBC: 9.1 10*3/uL (ref 4.0–10.5)

## 2018-11-01 MED ORDER — ONDANSETRON HCL 4 MG PO TABS: 4.0000 mg | ORAL_TABLET | Freq: Four times a day (QID) | ORAL | Status: DC | PRN

## 2018-11-01 MED ORDER — SODIUM CHLORIDE 0.9 % IV SOLN: 3.0000 g | Freq: Once | INTRAVENOUS | Status: DC

## 2018-11-01 MED ORDER — DOCUSATE SODIUM 100 MG PO CAPS
100.0000 mg | ORAL_CAPSULE | Freq: Two times a day (BID) | ORAL | Status: DC
  Administered 2018-11-01: 100 mg via ORAL
  Filled 2018-11-01: qty 1

## 2018-11-01 MED ORDER — MORPHINE SULFATE (PF) 2 MG/ML IV SOLN
2.0000 mg | Freq: Once | INTRAVENOUS | Status: AC
  Administered 2018-11-01: 2 mg via INTRAVENOUS

## 2018-11-01 MED ORDER — IBUPROFEN 600 MG PO TABS
600.0000 mg | ORAL_TABLET | Freq: Three times a day (TID) | ORAL | Status: DC | PRN
  Administered 2018-11-01 – 2018-11-02 (×2): 600 mg via ORAL
  Filled 2018-11-01 (×3): qty 1

## 2018-11-01 MED ORDER — ACETAMINOPHEN 325 MG PO TABS: 650.0000 mg | ORAL_TABLET | Freq: Four times a day (QID) | ORAL | Status: DC | PRN

## 2018-11-01 MED ORDER — MORPHINE SULFATE (PF) 2 MG/ML IV SOLN
INTRAVENOUS | Status: AC
Start: 2018-11-01 — End: 2018-11-01
  Filled 2018-11-01: qty 1

## 2018-11-01 MED ORDER — ONDANSETRON HCL 4 MG/2ML IJ SOLN
INTRAMUSCULAR | Status: AC
  Filled 2018-11-01: qty 2

## 2018-11-01 MED ORDER — ONDANSETRON HCL 4 MG/2ML IJ SOLN
4.0000 mg | Freq: Once | INTRAMUSCULAR | Status: AC
  Administered 2018-11-01: 4 mg via INTRAVENOUS

## 2018-11-01 MED ORDER — MAGIC MOUTHWASH W/LIDOCAINE: 5.0000 mL | Freq: Four times a day (QID) | ORAL | Status: DC | PRN

## 2018-11-01 MED ORDER — LIDOCAINE VISCOUS HCL 2 % MT SOLN
5.0000 mL | Freq: Four times a day (QID) | OROMUCOSAL | Status: DC | PRN
  Filled 2018-11-01: qty 15

## 2018-11-01 MED ORDER — ACETAMINOPHEN 650 MG RE SUPP: 650.0000 mg | Freq: Four times a day (QID) | RECTAL | Status: DC | PRN

## 2018-11-01 MED ORDER — ENOXAPARIN SODIUM 40 MG/0.4ML ~~LOC~~ SOLN
40.0000 mg | SUBCUTANEOUS | Status: DC
  Administered 2018-11-01: 40 mg via SUBCUTANEOUS
  Filled 2018-11-01: qty 0.4

## 2018-11-01 MED ORDER — ONDANSETRON HCL 4 MG/2ML IJ SOLN: 4.0000 mg | Freq: Four times a day (QID) | INTRAMUSCULAR | Status: DC | PRN

## 2018-11-01 MED ORDER — OXYCODONE-ACETAMINOPHEN 5-325 MG PO TABS: 1.0000 | ORAL_TABLET | Freq: Four times a day (QID) | ORAL | Status: DC | PRN

## 2018-11-01 MED ORDER — MAGIC MOUTHWASH
5.0000 mL | Freq: Four times a day (QID) | ORAL | Status: DC | PRN
  Filled 2018-11-01: qty 10

## 2018-11-01 MED ORDER — CLINDAMYCIN PHOSPHATE 900 MG/50ML IV SOLN
900.0000 mg | Freq: Once | INTRAVENOUS | Status: AC
  Administered 2018-11-01: 900 mg via INTRAVENOUS
  Filled 2018-11-01: qty 50

## 2018-11-01 MED ORDER — DEXAMETHASONE SODIUM PHOSPHATE 10 MG/ML IJ SOLN: 10.0000 mg | Freq: Once | INTRAMUSCULAR | Status: DC

## 2018-11-01 MED ORDER — DEXAMETHASONE SODIUM PHOSPHATE 10 MG/ML IJ SOLN
10.0000 mg | Freq: Two times a day (BID) | INTRAMUSCULAR | Status: DC
  Administered 2018-11-01: 10 mg via INTRAVENOUS
  Filled 2018-11-01 (×3): qty 1

## 2018-11-01 MED ORDER — IOHEXOL 300 MG/ML  SOLN
75.0000 mL | Freq: Once | INTRAMUSCULAR | Status: AC | PRN
  Administered 2018-11-01: 75 mL via INTRAVENOUS

## 2018-11-01 NOTE — ED Provider Notes (Signed)
[facility] Emergency Department Provider Note   First MD Initiated Contact with Patient 11/01/18 [phone]     (approximate)  I have reviewed the triage vital signs and the nursing notes.   HISTORY  Chief Complaint Sore Throat    HPI Suzanne Mathews is a 23 y.o. female with below list of previous medical conditions including peritonsillar abscess presents to the emergency department with persistent sore throat since 10/27/2008 when she was evaluated by in the emergency department.  Patient admits to increasing pain generalized body aches and subjective fevers        Past Medical History:  Diagnosis Date   Abscess of tonsil    Pilonidal cyst with abscess 2014    Patient Active Problem List   Diagnosis Date Noted   Peritonsillar abscess 11/01/2018   Pilonidal cyst 09/13/2014   Pilonidal cyst with abscess 07/30/2013    Past Surgical History:  Procedure Laterality Date   PILONIDAL CYST DRAINAGE  07-28-13   pilonidal cystectomy  08/02/14    Prior to Admission medications   Medication Sig Start Date End Date Taking? Authorizing Provider  brompheniramine-pseudoephedrine-DM 30-2-10 MG/5ML syrup Take 5 mLs by mouth 4 (four) times daily as needed. Mix with 5 mL of viscous lidocaine for swish and swallow. Patient taking differently: Take 5 mLs by mouth 4 (four) times daily as needed (mouth/ throat pain). Mix with 5 mL of viscous lidocaine for swish and swallow. 10/28/18  Yes Joni Reining, PA-C  ibuprofen (ADVIL,MOTRIN) 600 MG tablet Take 1 tablet (600 mg total) by mouth every 8 (eight) hours as needed. Patient taking differently: Take 600 mg by mouth every 8 (eight) hours as needed for fever or mild pain.  10/28/18  Yes Joni Reining, PA-C  lidocaine (XYLOCAINE) 2 % solution Use as directed 5 mLs in the mouth or throat every 6 (six) hours as needed for mouth pain. Mix with 5 mL of Bromfed-DM for swish and swallow. 10/28/18  Yes Joni Reining, PA-C      Allergies Patient has no known allergies.  History reviewed. No pertinent family history.  Social History Social History   Tobacco Use   Smoking status: Never Smoker   Smokeless tobacco: Never Used  Substance Use Topics   Alcohol use: No   Drug use: No    Review of Systems Constitutional: No fever/chills Eyes: No visual changes. ENT: No sore throat. Cardiovascular: Denies chest pain. Respiratory: Denies shortness of breath. Gastrointestinal: No abdominal pain.  No nausea, no vomiting.  No diarrhea.  No constipation. Genitourinary: Negative for dysuria. Musculoskeletal: Negative for neck pain.  Negative for back pain. Integumentary: Negative for rash. Neurological: Negative for headaches, focal weakness or numbness.  ____________________________________________   PHYSICAL EXAM:  VITAL SIGNS: ED Triage Vitals  Enc Vitals Group     BP 11/01/18 0347 109/72     Pulse Rate 11/01/18 0347 91     Resp 11/01/18 0347 17     Temp 11/01/18 0347 97.9 F (36.6 C)     Temp Source 11/01/18 0347 Oral     SpO2 11/01/18 0347 100 %     Weight 11/01/18 0351 59 kg (130 lb)     Height 11/01/18 0351 1.549 m (5\' 1" )     Head Circumference --      Peak Flow --      Pain Score 11/01/18 0351 10     Pain Loc --      Pain Edu? --  Excl. in GC? --     Constitutional: Alert and oriented.  Apparent discomfort  eyes: Conjunctivae are normal. Mouth/Throat: Mucous membranes are moist.  Pharyngeal erythema with left tonsillar hypertrophy uvula deviation to the right Neck: No stridor.  Positive anterior cervical lymphadenopathy Cardiovascular: Normal rate, regular rhythm. Good peripheral circulation. Grossly normal heart sounds. Respiratory: Normal respiratory effort.  No retractions. Lungs CTAB. Gastrointestinal: Soft and nontender. No distention.  Musculoskeletal: No lower extremity tenderness nor edema. No gross deformities of extremities. Neurologic:  Normal speech and  language. No gross focal neurologic deficits are appreciated.  Skin:  Skin is warm, dry and intact. No rash noted. Psychiatric: Mood and affect are normal. Speech and behavior are normal.  ____________________________________________   LABS (all labs ordered are listed, but only abnormal results are displayed)  Labs Reviewed  CBC - Abnormal; Notable for the following components:      Result Value   Hemoglobin 9.6 (*)    HCT 30.6 (*)    MCV 77.5 (*)    MCH 24.3 (*)    All other components within normal limits  COMPREHENSIVE METABOLIC PANEL - Abnormal; Notable for the following components:   Calcium 8.3 (*)    All other components within normal limits   ______ RADIOLOGY I, [place] Whalin, personally viewed and evaluated these images (plain radiographs) as part of my medical decision making, as well as reviewing the written report by the radiologist.  ED MD interpretation: Findings consistent with acute tonsillitis with superimposed 11 x 14 x 16 left tonsillar/peritonsillar abscess an additional 5 mm left-sided abscess also noted per radiologist  Official radiology report(s): Ct Soft Tissue Neck W Contrast  Result Date: 11/01/2018 CLINICAL DATA:  Initial evaluation for acute sore throat, stridor. EXAM: CT NECK WITH CONTRAST TECHNIQUE: Multidetector CT imaging of the neck was performed using the standard protocol following the bolus administration of intravenous contrast. CONTRAST:  75mL OMNIPAQUE IOHEXOL 300 MG/ML  SOLN COMPARISON:  Prior CT from 11/17/2017. FINDINGS: Pharynx and larynx: Oral cavity within normal limits. No acute abnormality about the dentition. Palatine tonsils enlarged and hyperemic, suggesting acute tonsillitis, left greater than right. Ill-defined edema surrounds the left tonsil. Superimposed 11 x 14 x 16 mm hypodense collection present at the left tonsil, consistent with tonsillar/peritonsillar abscess (series 2, image 27). Small 5 mm component seen just inferiorly  (series 9, image 44). Somewhat ill-defined 6 mm hypodensity at the lateral right tonsil suspicious for phlegmon and/or early abscess (series 2, image 29). Induration and inflammatory stranding present within the left parapharyngeal fat. Associated mild mucosal edema within the left oropharynx. Adenoidal soft tissues within normal limits. No retropharyngeal collection. Epiglottis normal. Vallecula clear. Remainder of the hypopharynx and supraglottic larynx within normal limits. True cords symmetric and normal. Subglottic airway clear. Salivary glands: Salivary glands including the parotid and submandibular glands within normal limits. Thyroid: Thyroid normal. Lymph nodes: Prominent bilateral level 2 lymph nodes, measuring up to 13-14 mm bilaterally, likely reactive. No other adenopathy within the neck. Vascular: Normal intravascular enhancement seen throughout the neck. Limited intracranial: Unremarkable. Visualized orbits: Visualized globes and orbital soft tissues within normal limits. Mastoids and visualized paranasal sinuses: Minimal mucosal thickening within the maxillary sinuses. Visualized paranasal sinuses are otherwise clear. Visualize mastoids and middle ear cavities are well pneumatized and free of fluid. Skeleton: No acute osseous finding. No discrete lytic or blastic osseous lesions. Upper chest: Visualized upper chest demonstrates no acute finding. Visualized lungs are clear. Other: None. IMPRESSION: 1. Findings consistent with acute  tonsillitis with superimposed 11 x 14 x 16 mm left tonsillar/peritonsillar abscess as above. Additional small 5 mm left-sided abscess located just inferiorly. 2. Ill-defined 6 mm hypodensity within the right tonsil, suspicious for phlegmon and/or early abscess. 3. Enlarged bilateral level II adenopathy, likely reactive. Electronically Signed   By: Rise Mu M.D.   On: 11/01/2018 05:12     _____________________ Procedures   ____________________________________________   INITIAL IMPRESSION / MDM / ASSESSMENT AND PLAN / ED COURSE  As part of my medical decision making, I reviewed the following data within the electronic MEDICAL RECORD NUMBER   23 year old female presenting with above-stated history and physical exam consistent with left tonsillar/peritonsillar abscess which was confirmed on CT scan.  Patient was given clindamycin 900 mg immediately after eating.  Patient discussed with Dr. Jenne Campus ENT who recommended Unasyn which was ordered.  Dr. Jenne Campus stated no surgical intervention warranted at this time.  Patient also discussed with Dr. Sheryle Hail hospitalist on-call for hospital admission for further evaluation and management.    ____________________________________________  FINAL CLINICAL IMPRESSION(S) / ED DIAGNOSES  Left peritonsillar abscess  MEDICATIONS GIVEN DURING THIS VISIT:  Medications  dexamethasone (DECADRON) injection 10 mg (has no administration in time range)  Ampicillin-Sulbactam (UNASYN) 3 g in sodium chloride 0.9 % 100 mL IVPB (has no administration in time range)  morphine 2 MG/ML injection 2 mg (2 mg Intravenous Given 11/01/18 0416)  ondansetron (ZOFRAN) injection 4 mg (4 mg Intravenous Given 11/01/18 0415)  clindamycin (CLEOCIN) IVPB 900 mg (0 mg Intravenous Stopped 11/01/18 0529)  iohexol (OMNIPAQUE) 300 MG/ML solution 75 mL (75 mLs Intravenous Contrast Given 11/01/18 0443)     ED Discharge Orders    None       Note:  This document was prepared using Dragon voice recognition software and may include unintentional dictation errors.   Darci Current, MD 11/01/18 [phone]

## 2018-11-01 NOTE — ED Notes (Signed)
ED TO INPATIENT HANDOFF REPORT  ED Nurse Name and Phone #: Perry Mount [phone]  S Name/Age/Gender Randol Kern 23 y.o. female Room/Bed: ED17A/ED17A  Code Status   Code Status: Not on file  Home/SNF/Other Home Patient oriented to: self, place, time and situation Is this baseline? Yes   Triage Complete: Triage complete  Chief Complaint Sore throat  Triage Note Pt seen here on 3/18 for sore throat and body aches; negative for strep and flu; pt returns tonight with increased pain; tearful in triage   Allergies No Known Allergies  Level of Care/Admitting Diagnosis ED Disposition    ED Disposition Condition Comment   Admit  Hospital Area: [facility] [[id number]]  Level of Care: Med-Surg [16]  Diagnosis: Peritonsillar abscess [475.ICD-9-CM]  Admitting Physician: Arnaldo Natal [[id number]]  Attending Physician: Arnaldo Natal [[id number]]  Estimated length of stay: past midnight tomorrow  Certification:: I certify this patient will need inpatient services for at least 2 midnights  PT Class (Do Not Modify): Inpatient [101]  PT Acc Code (Do Not Modify): Private [1]       B Medical/Surgery History Past Medical History:  Diagnosis Date  . Abscess of tonsil   . Pilonidal cyst with abscess 2014   Past Surgical History:  Procedure Laterality Date  . PILONIDAL CYST DRAINAGE  07-28-13  . pilonidal cystectomy  08/02/14     A IV Location/Drains/Wounds Patient Lines/Drains/Airways Status   Active Line/Drains/Airways    Name:   Placement date:   Placement time:   Site:   Days:   Peripheral IV 11/01/18 Right Antecubital   11/01/18    0411    Antecubital   less than 1          Intake/Output Last 24 hours No intake or output data in the 24 hours ending 11/01/18 7829  Labs/Imaging Results for orders placed or performed during the hospital encounter of 11/01/18 (from the past 48 hour(s))  CBC     Status: Abnormal   Collection Time: 11/01/18   4:09 AM  Result Value Ref Range   WBC 9.1 4.0 - 10.5 K/uL   RBC 3.95 3.87 - 5.11 MIL/uL   Hemoglobin 9.6 (L) 12.0 - 15.0 g/dL   HCT 56.2 (L) 13.0 - 86.5 %   MCV 77.5 (L) 80.0 - 100.0 fL   MCH 24.3 (L) 26.0 - 34.0 pg   MCHC 31.4 30.0 - 36.0 g/dL   RDW 78.4 69.6 - 29.5 %   Platelets 235 150 - 400 K/uL   nRBC 0.0 0.0 - 0.2 %    Comment: Performed at [facility], [address] [place], [place] [address]  Comprehensive metabolic panel     Status: Abnormal   Collection Time: 11/01/18  4:09 AM  Result Value Ref Range   Sodium 141 135 - 145 mmol/L   Potassium 3.5 3.5 - 5.1 mmol/L   Chloride 108 98 - 111 mmol/L   CO2 24 22 - 32 mmol/L   Glucose, Bld 97 70 - 99 mg/dL   BUN 8 6 - 20 mg/dL   Creatinine, Ser 2.44 0.44 - 1.00 mg/dL   Calcium 8.3 (L) 8.9 - 10.3 mg/dL   Total Protein 7.2 6.5 - 8.1 g/dL   Albumin 3.7 3.5 - 5.0 g/dL   AST 16 15 - 41 U/L   ALT 11 0 - 44 U/L   Alkaline Phosphatase 45 38 - 126 U/L   Total Bilirubin 0.3 0.3 - 1.2 mg/dL  GFR calc non Af Amer >60 >60 mL/min   GFR calc Af Amer >60 >60 mL/min   Anion gap 9 5 - 15    Comment: Performed at [facility], [address] [place], [place] [address]   Ct Soft Tissue Neck W Contrast  Result Date: 11/01/2018 CLINICAL DATA:  Initial evaluation for acute sore throat, stridor. EXAM: CT NECK WITH CONTRAST TECHNIQUE: Multidetector CT imaging of the neck was performed using the standard protocol following the bolus administration of intravenous contrast. CONTRAST:  75mL OMNIPAQUE IOHEXOL 300 MG/ML  SOLN COMPARISON:  Prior CT from 11/17/2017. FINDINGS: Pharynx and larynx: Oral cavity within normal limits. No acute abnormality about the dentition. Palatine tonsils enlarged and hyperemic, suggesting acute tonsillitis, left greater than right. Ill-defined edema surrounds the left tonsil. Superimposed 11 x 14 x 16 mm hypodense collection present at the left tonsil, consistent with tonsillar/peritonsillar abscess  (series 2, image 27). Small 5 mm component seen just inferiorly (series 9, image 44). Somewhat ill-defined 6 mm hypodensity at the lateral right tonsil suspicious for phlegmon and/or early abscess (series 2, image 29). Induration and inflammatory stranding present within the left parapharyngeal fat. Associated mild mucosal edema within the left oropharynx. Adenoidal soft tissues within normal limits. No retropharyngeal collection. Epiglottis normal. Vallecula clear. Remainder of the hypopharynx and supraglottic larynx within normal limits. True cords symmetric and normal. Subglottic airway clear. Salivary glands: Salivary glands including the parotid and submandibular glands within normal limits. Thyroid: Thyroid normal. Lymph nodes: Prominent bilateral level 2 lymph nodes, measuring up to 13-14 mm bilaterally, likely reactive. No other adenopathy within the neck. Vascular: Normal intravascular enhancement seen throughout the neck. Limited intracranial: Unremarkable. Visualized orbits: Visualized globes and orbital soft tissues within normal limits. Mastoids and visualized paranasal sinuses: Minimal mucosal thickening within the maxillary sinuses. Visualized paranasal sinuses are otherwise clear. Visualize mastoids and middle ear cavities are well pneumatized and free of fluid. Skeleton: No acute osseous finding. No discrete lytic or blastic osseous lesions. Upper chest: Visualized upper chest demonstrates no acute finding. Visualized lungs are clear. Other: None. IMPRESSION: 1. Findings consistent with acute tonsillitis with superimposed 11 x 14 x 16 mm left tonsillar/peritonsillar abscess as above. Additional small 5 mm left-sided abscess located just inferiorly. 2. Ill-defined 6 mm hypodensity within the right tonsil, suspicious for phlegmon and/or early abscess. 3. Enlarged bilateral level II adenopathy, likely reactive. Electronically Signed   By: Rise Mu M.D.   On: 11/01/2018 05:12    Pending  Labs [employer] (From admission, onward)    Start     Ordered   Signed and Held  Creatinine, serum  (enoxaparin (LOVENOX)    CrCl >/= 30 ml/min)  Weekly,   R    Comments:  while on enoxaparin therapy    Signed and Held          Vitals/Pain Today's Vitals   11/01/18 0351 11/01/18 0422 11/01/18 0431 11/01/18 0435  BP:  101/62 106/66   Pulse:  82 85   Resp:  16 15   Temp:      TempSrc:      SpO2:  99% 100%   Weight: 59 kg     Height:  (1.549 m)     PainSc: 10-Worst pain ever   10-Worst pain ever    Isolation Precautions No active isolations  Medications Medications  dexamethasone (DECADRON) injection 10 mg (has no administration in time range)  Ampicillin-Sulbactam (UNASYN) 3 g in sodium chloride 0.9 % 100 mL  IVPB (has no administration in time range)  morphine 2 MG/ML injection 2 mg (2 mg Intravenous Given 11/01/18 0416)  ondansetron (ZOFRAN) injection 4 mg (4 mg Intravenous Given 11/01/18 0415)  clindamycin (CLEOCIN) IVPB 900 mg (0 mg Intravenous Stopped 11/01/18 0529)  iohexol (OMNIPAQUE) 300 MG/ML solution 75 mL (75 mLs Intravenous Contrast Given 11/01/18 0443)    Mobility walks Low fall risk   Focused Assessments Cardiac Assessment Handoff:    No results found for: CKTOTAL, CKMB, CKMBINDEX, TROPONINI No results found for: DDIMER Does the Patient currently have chest pain? No      R Recommendations: See Admitting Provider Note  Report given to:   Additional Notes:

## 2018-11-01 NOTE — H&P (Addendum)
Suzanne Mathews is an 23 y.o. female.   Chief Complaint: Sore throat HPI: The patient with no chronic medical problems presents to the emergency department complaining of sore throat, body aches and drooling x3 days.  The patient was seen in the emergency department 3 days ago for the same.  Today CT scan of her neck demonstrates a peritonsillar abscess.  She is negative for the flu and rapid strep test was negative.  The patient also felt like she was wheezing.  She has not had any respiratory distress.  Once found to be stable the emergency department staff called the hospitalist service for admission.  Past Medical History:  Diagnosis Date  . Abscess of tonsil   . Pilonidal cyst with abscess 2014    Past Surgical History:  Procedure Laterality Date  . PILONIDAL CYST DRAINAGE  07-28-13  . pilonidal cystectomy  08/02/14    History reviewed. No pertinent family history. Healthy parents  Social History:  reports that she has never smoked. She has never used smokeless tobacco. She reports that she does not drink alcohol or use drugs.  Allergies: No Known Allergies  Prior to Admission medications   Medication Sig Start Date End Date Taking? Authorizing Provider  brompheniramine-pseudoephedrine-DM 30-2-10 MG/5ML syrup Take 5 mLs by mouth 4 (four) times daily as needed. Mix with 5 mL of viscous lidocaine for swish and swallow. Patient taking differently: Take 5 mLs by mouth 4 (four) times daily as needed (mouth/ throat pain). Mix with 5 mL of viscous lidocaine for swish and swallow. 10/28/18  Yes Joni Reining, PA-C  ibuprofen (ADVIL,MOTRIN) 600 MG tablet Take 1 tablet (600 mg total) by mouth every 8 (eight) hours as needed. Patient taking differently: Take 600 mg by mouth every 8 (eight) hours as needed for fever or mild pain.  10/28/18  Yes Joni Reining, PA-C  lidocaine (XYLOCAINE) 2 % solution Use as directed 5 mLs in the mouth or throat every 6 (six) hours as needed for mouth pain. Mix  with 5 mL of Bromfed-DM for swish and swallow. 10/28/18  Yes Joni Reining, PA-C     Results for orders placed or performed during the hospital encounter of 11/01/18 (from the past 48 hour(s))  CBC     Status: Abnormal   Collection Time: 11/01/18  4:09 AM  Result Value Ref Range   WBC 9.1 4.0 - 10.5 K/uL   RBC 3.95 3.87 - 5.11 MIL/uL   Hemoglobin 9.6 (L) 12.0 - 15.0 g/dL   HCT 02.4 (L) 09.7 - 35.3 %   MCV 77.5 (L) 80.0 - 100.0 fL   MCH 24.3 (L) 26.0 - 34.0 pg   MCHC 31.4 30.0 - 36.0 g/dL   RDW 29.9 24.2 - 68.3 %   Platelets 235 150 - 400 K/uL   nRBC 0.0 0.0 - 0.2 %    Comment: Performed at [facility], [address] [place], [place] [address]  Comprehensive metabolic panel     Status: Abnormal   Collection Time: 11/01/18  4:09 AM  Result Value Ref Range   Sodium 141 135 - 145 mmol/L   Potassium 3.5 3.5 - 5.1 mmol/L   Chloride 108 98 - 111 mmol/L   CO2 24 22 - 32 mmol/L   Glucose, Bld 97 70 - 99 mg/dL   BUN 8 6 - 20 mg/dL   Creatinine, Ser 2.29 0.44 - 1.00 mg/dL   Calcium 8.3 (L) 8.9 - 10.3 mg/dL   Total Protein 7.2  6.5 - 8.1 g/dL   Albumin 3.7 3.5 - 5.0 g/dL   AST 16 15 - 41 U/L   ALT 11 0 - 44 U/L   Alkaline Phosphatase 45 38 - 126 U/L   Total Bilirubin 0.3 0.3 - 1.2 mg/dL   GFR calc non Af Amer >60 >60 mL/min   GFR calc Af Amer >60 >60 mL/min   Anion gap 9 5 - 15    Comment: Performed at [facility], [address] [place], [place] [address]   Ct Soft Tissue Neck W Contrast  Result Date: 11/01/2018 CLINICAL DATA:  Initial evaluation for acute sore throat, stridor. EXAM: CT NECK WITH CONTRAST TECHNIQUE: Multidetector CT imaging of the neck was performed using the standard protocol following the bolus administration of intravenous contrast. CONTRAST:  75mL OMNIPAQUE IOHEXOL 300 MG/ML  SOLN COMPARISON:  Prior CT from 11/17/2017. FINDINGS: Pharynx and larynx: Oral cavity within normal limits. No acute abnormality about the dentition. Palatine tonsils  enlarged and hyperemic, suggesting acute tonsillitis, left greater than right. Ill-defined edema surrounds the left tonsil. Superimposed 11 x 14 x 16 mm hypodense collection present at the left tonsil, consistent with tonsillar/peritonsillar abscess (series 2, image 27). Small 5 mm component seen just inferiorly (series 9, image 44). Somewhat ill-defined 6 mm hypodensity at the lateral right tonsil suspicious for phlegmon and/or early abscess (series 2, image 29). Induration and inflammatory stranding present within the left parapharyngeal fat. Associated mild mucosal edema within the left oropharynx. Adenoidal soft tissues within normal limits. No retropharyngeal collection. Epiglottis normal. Vallecula clear. Remainder of the hypopharynx and supraglottic larynx within normal limits. True cords symmetric and normal. Subglottic airway clear. Salivary glands: Salivary glands including the parotid and submandibular glands within normal limits. Thyroid: Thyroid normal. Lymph nodes: Prominent bilateral level 2 lymph nodes, measuring up to 13-14 mm bilaterally, likely reactive. No other adenopathy within the neck. Vascular: Normal intravascular enhancement seen throughout the neck. Limited intracranial: Unremarkable. Visualized orbits: Visualized globes and orbital soft tissues within normal limits. Mastoids and visualized paranasal sinuses: Minimal mucosal thickening within the maxillary sinuses. Visualized paranasal sinuses are otherwise clear. Visualize mastoids and middle ear cavities are well pneumatized and free of fluid. Skeleton: No acute osseous finding. No discrete lytic or blastic osseous lesions. Upper chest: Visualized upper chest demonstrates no acute finding. Visualized lungs are clear. Other: None. IMPRESSION: 1. Findings consistent with acute tonsillitis with superimposed 11 x 14 x 16 mm left tonsillar/peritonsillar abscess as above. Additional small 5 mm left-sided abscess located just inferiorly. 2.  Ill-defined 6 mm hypodensity within the right tonsil, suspicious for phlegmon and/or early abscess. 3. Enlarged bilateral level II adenopathy, likely reactive. Electronically Signed   By: Rise Mu M.D.   On: 11/01/2018 05:12    Review of Systems  Constitutional: Negative for chills and fever.  HENT: Positive for sore throat. Negative for tinnitus.   Eyes: Negative for blurred vision and redness.  Respiratory: Positive for stridor. Negative for cough and shortness of breath.   Cardiovascular: Negative for chest pain, palpitations, orthopnea and PND.  Gastrointestinal: Negative for abdominal pain, diarrhea, nausea and vomiting.  Genitourinary: Negative for dysuria, frequency and urgency.  Musculoskeletal: Negative for joint pain and myalgias.  Skin: Negative for rash.       No lesions  Neurological: Negative for speech change, focal weakness and weakness.  Endo/Heme/Allergies: Does not bruise/bleed easily.       No temperature intolerance  Psychiatric/Behavioral: Negative for depression and suicidal ideas.  Blood pressure 106/66, pulse 85, temperature 97.9 F (36.6 C), temperature source Oral, resp. rate 15, height 5\' 1"  (1.549 m), weight 59 kg, last menstrual period 10/26/2018, SpO2 100 %. Physical Exam  Vitals reviewed. Constitutional: She is oriented to person, place, and time. She appears well-developed and well-nourished. No distress.  HENT:  Head: Normocephalic and atraumatic.  Mouth/Throat:    Eyes: Pupils are equal, round, and reactive to light. Conjunctivae and EOM are normal.  Neck: Normal range of motion. Neck supple. No JVD present. No tracheal deviation present. No thyromegaly present.  Cardiovascular: Normal rate, regular rhythm and normal heart sounds. Exam reveals no gallop and no friction rub.  No murmur heard. Respiratory: Effort normal and breath sounds normal.  GI: Soft. Bowel sounds are normal. She exhibits no distension. There is no abdominal  tenderness.  Genitourinary:    Genitourinary Comments: Deferred   Musculoskeletal: Normal range of motion.        General: No edema.  Lymphadenopathy:    She has no cervical adenopathy.  Neurological: She is alert and oriented to person, place, and time. No cranial nerve deficit. She exhibits normal muscle tone.  Skin: Skin is warm and dry. No rash noted. No erythema.  Psychiatric: She has a normal mood and affect. Her behavior is normal. Judgment and thought content normal.     Assessment/Plan This is a 23 year old female admitted for peritonsillar abscess. 1.  Peritonsillar abscess: With associated phlegmon.  ED physician discussed with the ENT no surgical intervention planned at this time.  No signs or symptoms of sepsis.  Continue Unasyn. 2.  Stridor: No hypoxia.  Decadron given in the emergency department.  No respiratory distress at this time.  Continue Decadron. 3.  DVT prophylaxis: Lovenox 4.  GI prophylaxis: None The patient is a full code.  Time spent on admission orders and patient care approximately 45 minutes  Arnaldo Natal, MD 11/01/2018, 6:57 AM

## 2018-11-01 NOTE — ED Triage Notes (Signed)
Pt seen here on 3/18 for sore throat and body aches; negative for strep and flu; pt returns tonight with increased pain; tearful in triage

## 2018-11-01 NOTE — ED Notes (Signed)
Report given to [employer], [place]

## 2018-11-01 NOTE — Plan of Care (Signed)
  Problem: Education: Goal: Knowledge of General Education information will improve Description Including pain rating scale, medication(s)/side effects and non-pharmacologic comfort measures Outcome: Progressing   Problem: Health Behavior/Discharge Planning: Goal: Ability to manage health-related needs will improve Outcome: Progressing   Problem: Clinical Measurements: Goal: Ability to maintain clinical measurements within normal limits will improve Outcome: Progressing Goal: Will remain free from infection Outcome: Progressing   Problem: Activity: Goal: Risk for activity intolerance will decrease Outcome: Progressing   Problem: Nutrition: Goal: Adequate nutrition will be maintained Outcome: Progressing   Problem: Elimination: Goal: Will not experience complications related to bowel motility Outcome: Progressing   Problem: Safety: Goal: Ability to remain free from injury will improve Outcome: Progressing

## 2018-11-01 NOTE — ED Notes (Signed)
ED Provider at bedside. 

## 2018-11-02 MED ORDER — PREDNISONE 10 MG (21) PO TBPK: ORAL_TABLET | ORAL | 0 refills | Status: DC

## 2018-11-02 MED ORDER — AMOXICILLIN-POT CLAVULANATE 875-125 MG PO TABS: 1.0000 | ORAL_TABLET | Freq: Two times a day (BID) | ORAL | 0 refills | Status: AC

## 2018-11-02 MED ORDER — SODIUM CHLORIDE 0.9 % IV SOLN
3.0000 g | Freq: Four times a day (QID) | INTRAVENOUS | Status: DC
  Filled 2018-11-02 (×3): qty 3

## 2018-11-02 NOTE — Progress Notes (Signed)
Referral received from the patient's nurse. Chaplain learned that the patient may be able to go home today if she tolerates soft foods at lunchtime. Upon arrival, the patient was laying in bed and preparing to take a nap. She appears to be in good spirits and no obvious distress. She inquired about the time she can expect lunch; chaplain gladly confirmed that should be around noon. Chaplain will follow-up with patient after lunch.   11/02/18 1100  Clinical Encounter Type  Visited With Patient;Health care provider  Visit Type Initial  Referral From Nurse

## 2018-11-02 NOTE — Discharge Instructions (Signed)
Resume diet and activity as before ° ° °

## 2018-11-06 NOTE — Discharge Summary (Signed)
[facility] [place] [facility]   PATIENT NAME: Suzanne Mathews    MR#:  [id number]  DATE OF BIRTH:  16-May-1996  DATE OF ADMISSION:  11/01/2018 ADMITTING PHYSICIAN: Arnaldo Natal, MD  DATE OF DISCHARGE: 11/02/2018  2:34 PM  PRIMARY CARE PHYSICIAN: Charlton Amor, MD   ADMISSION DIAGNOSIS:  Sore throat  DISCHARGE DIAGNOSIS:  Active Problems:   Peritonsillar abscess   SECONDARY DIAGNOSIS:   Past Medical History:  Diagnosis Date  . Abscess of tonsil   . Pilonidal cyst with abscess 2014     ADMITTING HISTORY  Chief Complaint: Sore throat HPI: The patient with no chronic medical problems presents to the emergency department complaining of sore throat, body aches and drooling x3 days.  The patient was seen in the emergency department 3 days ago for the same.  Today CT scan of her neck demonstrates a peritonsillar abscess.  She is negative for the flu and rapid strep test was negative.  The patient also felt like she was wheezing.  She has not had any respiratory distress.  Once found to be stable the emergency department staff called the hospitalist service for admission.  HOSPITAL COURSE:   *Peritonsillar abscess *Stridor  Patient was admitted to medical floor.  Started on IV steroids and stridor quickly resolved in the ER.  Started on Unasyn along with steroids and improved well.  By day of discharge she is tolerating soft diet.  No significant pain.  Will be discharged home.  ENT referral at discharge.  Work note given  Discharged home in stable condition.  CONSULTS OBTAINED:    DRUG ALLERGIES:  No Known Allergies  DISCHARGE MEDICATIONS:   Allergies as of 11/02/2018   No Known Allergies     Medication List    TAKE these medications   amoxicillin-clavulanate 875-125 MG tablet Commonly known as:  Augmentin Take 1 tablet by mouth 2 (two) times daily for 7 days.   brompheniramine-pseudoephedrine-DM 30-2-10 MG/5ML syrup Take 5 mLs by  mouth 4 (four) times daily as needed. Mix with 5 mL of viscous lidocaine for swish and swallow. What changed:  reasons to take this   ibuprofen 600 MG tablet Commonly known as:  ADVIL,MOTRIN Take 1 tablet (600 mg total) by mouth every 8 (eight) hours as needed. What changed:  reasons to take this   lidocaine 2 % solution Commonly known as:  XYLOCAINE Use as directed 5 mLs in the mouth or throat every 6 (six) hours as needed for mouth pain. Mix with 5 mL of Bromfed-DM for swish and swallow.   predniSONE 10 MG (21) Tbpk tablet Commonly known as:  STERAPRED UNI-PAK 21 TAB 6 tabs day 1 and taper 10 mg a day - 6 days       Today   VITAL SIGNS:  Blood pressure (!) 99/56, pulse 61, temperature 98.3 F (36.8 C), temperature source Oral, resp. rate 18, height 5\' 1"  (1.549 m), weight 58.1 kg, last menstrual period 10/26/2018, SpO2 100 %.  I/O:  No intake or output data in the 24 hours ending 11/06/18 1455  PHYSICAL EXAMINATION:  Physical Exam  GENERAL:  23 y.o.-year-old patient lying in the bed with no acute distress.  LUNGS: Normal breath sounds bilaterally, no wheezing, rales,rhonchi or crepitation. No use of accessory muscles of respiration.  CARDIOVASCULAR: S1, S2 normal. No murmurs, rubs, or gallops.  ABDOMEN: Soft, non-tender, non-distended. Bowel sounds present. No organomegaly or mass.  NEUROLOGIC: Moves all 4 extremities. PSYCHIATRIC: The patient is  alert and oriented x 3.  SKIN: No obvious rash, lesion, or ulcer.   DATA REVIEW:   CBC Recent Labs  Lab 11/01/18 0409  WBC 9.1  HGB 9.6*  HCT 30.6*  PLT 235    Chemistries  Recent Labs  Lab 11/01/18 0409  NA 141  K 3.5  CL 108  CO2 24  GLUCOSE 97  BUN 8  CREATININE 0.64  CALCIUM 8.3*  AST 16  ALT 11  ALKPHOS 45  BILITOT 0.3    Cardiac Enzymes No results for input(s): TROPONINI in the last 168 hours.  Microbiology Results  Results for orders placed or performed during the hospital encounter of  10/28/18  Group A Strep by PCR ([facility] Only)     Status: None   Collection Time: 10/28/18 12:15 PM  Result Value Ref Range Status   Group A Strep by PCR NOT DETECTED NOT DETECTED Final    Comment: Performed at [facility], [address] [place], [place] [address]    RADIOLOGY:  No results found.  Follow up with PCP in 1 week.  Management plans discussed with the patient, family and they are in agreement.  CODE STATUS:  Code Status History    Date Active Date Inactive Code Status Order ID Comments User Context   11/01/2018 0821 11/02/2018 1740 Full Code [id number]  Arnaldo Natal, MD Inpatient      TOTAL TIME TAKING CARE OF THIS PATIENT ON DAY OF DISCHARGE: more than 30 minutes.   Molinda Bailiff Trystan Eads M.D on 11/06/2018 at 2:55 PM  Between 7am to 6pm - Pager - [phone]  After 6pm go to [email or web address] - password [email or web address] [facility]  [facility]  Office  [phone]  CC: Primary care physician; Charlton Amor, MD  Note: This dictation was prepared with Dragon dictation along with smaller phrase technology. Any transcriptional errors that result from this process are unintentional.

## 2019-01-11 LAB — HM HIV SCREENING LAB: HM HIV Screening: NEGATIVE

## 2019-01-11 LAB — HM PAP SMEAR: HM Pap smear: NEGATIVE

## 2019-03-19 ENCOUNTER — Encounter: Payer: Self-pay | Admitting: Physician Assistant

## 2019-03-19 ENCOUNTER — Ambulatory Visit: Payer: Self-pay | Admitting: Physician Assistant

## 2019-03-19 ENCOUNTER — Other Ambulatory Visit: Payer: Self-pay

## 2019-03-19 DIAGNOSIS — N760 Acute vaginitis: Secondary | ICD-10-CM

## 2019-03-19 DIAGNOSIS — Z113 Encounter for screening for infections with a predominantly sexual mode of transmission: Secondary | ICD-10-CM

## 2019-03-19 DIAGNOSIS — B9689 Other specified bacterial agents as the cause of diseases classified elsewhere: Secondary | ICD-10-CM

## 2019-03-19 LAB — WET PREP FOR TRICH, YEAST, CLUE
Trichomonas Exam: NEGATIVE
Yeast Exam: NEGATIVE

## 2019-03-19 MED ORDER — METRONIDAZOLE 500 MG PO TABS: 500.0000 mg | ORAL_TABLET | Freq: Two times a day (BID) | ORAL | 0 refills | Status: DC

## 2019-03-19 NOTE — Progress Notes (Signed)
Here for STD screening today. Accepts bloodwork. Hal Morales, RN Wet Prep results reviewed. Patient treated for BV per standing orders. Hal Morales, RN

## 2019-03-19 NOTE — Progress Notes (Signed)
STI clinic/screening visit  Subjective:  Suzanne Mathews is a 23 y.o. female being seen today for an STI screening visit. The patient reports they do have symptoms.  Patient has the following medical conditions:   Patient Active Problem List   Diagnosis Date Noted  . Peritonsillar abscess 11/01/2018  . Pilonidal cyst 09/13/2014  . Pilonidal cyst with abscess 07/30/2013     Chief Complaint  Patient presents with  . SEXUALLY TRANSMITTED DISEASE    HPI  Patient reports that she has been having vaginal discharge with odor, itching, and spotting for 2 weeks.  Reports that she has had Trich in the past and is concerned that she may have it again.   Denies other concerns and symptoms today.  Patient using Depo for BCM.   See flowsheet for further details and programmatic requirements.    The following portions of the patient's history were reviewed and updated as appropriate: allergies, current medications, past medical history, past social history, past surgical history and problem list.  Objective:  There were no vitals filed for this visit.  Physical Exam Constitutional:      General: She is not in acute distress.    Appearance: Normal appearance.  HENT:     Head: Normocephalic and atraumatic.     Mouth/Throat:     Mouth: Mucous membranes are moist.     Pharynx: Oropharynx is clear. No oropharyngeal exudate or posterior oropharyngeal erythema.  Neck:     Musculoskeletal: Neck supple.  Pulmonary:     Effort: Pulmonary effort is normal.  Abdominal:     Palpations: Abdomen is soft. There is no mass.     Tenderness: There is no abdominal tenderness. There is no guarding or rebound.  Genitourinary:    General: Normal vulva.     Rectum: Normal.     Comments: External genitalia/pubic area without nits, lice, edema, erythema, lesions and inguinal adenopathy.  Vagina with normal mucosa, small amount of brownish discharge, pH= >4.5 Cervix without visible lesions Uterus  normal size, firm, mobile, nt, no masses, no CMT, no adnexal tenderness or fullness. Lymphadenopathy:     Cervical: No cervical adenopathy.  Skin:    General: Skin is warm and dry.     Findings: No bruising, erythema or lesion.  Neurological:     Mental Status: She is alert and oriented to person, place, and time.  Psychiatric:        Mood and Affect: Mood normal.        Behavior: Behavior normal.        Thought Content: Thought content normal.        Judgment: Judgment normal.       Assessment and Plan:  Suzanne Mathews is a 23 y.o. female presenting to the [facility] Department for STI screening  1. Screening for STD (sexually transmitted disease) Patient with symptoms today. Rec condoms with all sex. Await test results.  Counseled patient that RN will call if needs to RTC for further treatment after results are back. - WET PREP FOR [place], YEAST, CLUE - Chlamydia/Gonorrhea [place] [facility] - HIV [place] [facility] - Syphilis Serology,  Lab  2. BV (bacterial vaginosis) Will treat for BV due to s/s and results of wet mount. Give Metronidazole 500 mg #14 1 po BID for 7 days with food, no EtOH for 24 hr before and until 72 hr after completing medicine. Rec using OTC anti-fungal cream if needed if has increased itching during  or after treatment.  - metroNIDAZOLE (FLAGYL) 500 MG tablet; Take 1 tablet (500 mg total) by mouth 2 (two) times daily.  Dispense: 14 tablet; Refill: 0     No follow-ups on file.  No future appointments.  Matt Holmesarla J Hampton, PA

## 2019-03-29 ENCOUNTER — Telehealth: Payer: Self-pay

## 2019-03-29 NOTE — Telephone Encounter (Signed)
TC to patient. Verified ID via password/SS#. Informed of positive Chlamydia and need for tx. Instructed to eat before visit and have partner call for tx appt. Appt scheduled for 03/30/19 Aileen Fass, RN

## 2019-03-30 ENCOUNTER — Ambulatory Visit: Payer: Self-pay

## 2019-03-30 ENCOUNTER — Other Ambulatory Visit: Payer: Self-pay

## 2019-03-30 DIAGNOSIS — A749 Chlamydial infection, unspecified: Secondary | ICD-10-CM

## 2019-03-30 MED ORDER — AZITHROMYCIN 500 MG PO TABS
1000.0000 mg | ORAL_TABLET | Freq: Once | ORAL | Status: AC
  Administered 2019-03-30: 1000 mg via ORAL

## 2019-03-30 NOTE — Progress Notes (Signed)
Tx for Chlamydia per SO; tolerated well Aileen Fass, RN

## 2019-04-22 ENCOUNTER — Other Ambulatory Visit: Payer: Self-pay

## 2019-04-22 ENCOUNTER — Ambulatory Visit: Payer: Medicaid Other | Admitting: Physician Assistant

## 2019-04-22 ENCOUNTER — Encounter: Payer: Self-pay | Admitting: Physician Assistant

## 2019-04-22 ENCOUNTER — Ambulatory Visit (LOCAL_COMMUNITY_HEALTH_CENTER): Payer: Medicaid Other | Admitting: Physician Assistant

## 2019-04-22 VITALS — BP 96/60 | Ht 61.0 in | Wt 133.0 lb

## 2019-04-22 DIAGNOSIS — Z3009 Encounter for other general counseling and advice on contraception: Secondary | ICD-10-CM

## 2019-04-22 DIAGNOSIS — Z30013 Encounter for initial prescription of injectable contraceptive: Secondary | ICD-10-CM

## 2019-04-22 DIAGNOSIS — N898 Other specified noninflammatory disorders of vagina: Secondary | ICD-10-CM

## 2019-04-22 DIAGNOSIS — Z113 Encounter for screening for infections with a predominantly sexual mode of transmission: Secondary | ICD-10-CM

## 2019-04-22 DIAGNOSIS — Z308 Encounter for other contraceptive management: Secondary | ICD-10-CM | POA: Diagnosis not present

## 2019-04-22 LAB — WET PREP FOR TRICH, YEAST, CLUE
Clue Cell Exam: POSITIVE — AB
Trichomonas Exam: NEGATIVE
Yeast Exam: NEGATIVE

## 2019-04-22 MED ORDER — MEDROXYPROGESTERONE ACETATE 150 MG/ML IM SUSP
150.0000 mg | Freq: Once | INTRAMUSCULAR | Status: AC
  Administered 2019-04-22: 150 mg via INTRAMUSCULAR

## 2019-04-22 NOTE — Progress Notes (Signed)
Here today for Depo and STD screening. Complains of Vaginal and rectal x 1 month. Hal Morales, RN Wet Prep results reviewed. No treatment indicated per standing orders. Hal Morales, RN

## 2019-04-22 NOTE — Progress Notes (Signed)
  Subjective:     Patient ID: Suzanne Mathews, female   DOB: 08/01/1996, 23 y.o.   MRN: [id number]  HPI Woman presents for eval of recent black vaginal discharge and vaginal and rectal pain for 30 days. Having bowel movement every 2 days, unchanged. Recently treated for Trich and Chlamydia. Unsure if partner has been treated. Also desires DMPA injection, last given 01/12/19.   Review of Systems  Constitutional: Negative.   HENT: Positive for mouth sores.   Gastrointestinal: Positive for abdominal pain and rectal pain.  Genitourinary: Positive for genital sores, pelvic pain and vaginal discharge.  Skin: Positive for rash.  Neurological: Negative.        Objective:   Physical Exam Exam conducted with a chaperone present.  Constitutional:      Appearance: Normal appearance.  HENT:     Mouth/Throat:     Mouth: Mucous membranes are moist.     Pharynx: Oropharynx is clear. No oropharyngeal exudate or posterior oropharyngeal erythema.  Neck:     Musculoskeletal: Normal range of motion.  Abdominal:     Palpations: Abdomen is soft. There is no mass.     Tenderness: There is no abdominal tenderness. There is no guarding.  Genitourinary:    General: Normal vulva.     Pubic Area: No rash.      Labia:        Right: No rash, tenderness or lesion.        Left: No rash, tenderness or lesion.      Vagina: Vaginal discharge present. No tenderness, bleeding or lesions.     Cervix: Normal.     Uterus: Normal.      Adnexa: Right adnexa normal and left adnexa normal.     Rectum: Normal.     Comments: Mons pubis shaved. Mod amt thick creamy vag d/c with brown tint, pH < 4.5. Lymphadenopathy:     Cervical: No cervical adenopathy.  Skin:    General: Skin is warm and dry.  Neurological:     General: No focal deficit present.     Mental Status: She is alert and oriented to person, place, and time.  Psychiatric:        Mood and Affect: Mood normal.        Behavior: Behavior normal.      Thought Content: Thought content normal.        Judgment: Judgment normal.        Assessment:     1. Family planning services 2. Vaginal discharge    Plan:   1. give DMPA 150mg  IM today and repeat every 11-13 weeks for 1 year. 2.  wet prep pos clue, but no clear BV dx, so will not treat. Await GC and Chlam results, ae well as HIV and syphilis tests.

## 2019-04-22 NOTE — Progress Notes (Signed)
Here today for STD  screening. Accepts bloodwork. Complains of vaginal and rectal pain x 1 month. Hal Morales, RN

## 2019-04-26 LAB — GONOCOCCUS CULTURE

## 2019-05-18 ENCOUNTER — Ambulatory Visit: Payer: Medicaid Other

## 2019-05-24 ENCOUNTER — Ambulatory Visit: Payer: Medicaid Other

## 2019-06-03 ENCOUNTER — Encounter: Payer: Self-pay | Admitting: Physician Assistant

## 2019-06-03 NOTE — Progress Notes (Signed)
   Family planning clininc visit  Subjective:  Suzanne Mathews is a 23 y.o. female being seen today for a Family Planning visit. Desires DPMA.  The patient reports they do have symptoms.  Patient has the following medical conditions:   Patient Active Problem List   Diagnosis Date Noted  . Peritonsillar abscess 11/01/2018  . Pilonidal cyst 09/13/2014  . Pilonidal cyst with abscess 07/30/2013     Chief Complaint  Patient presents with  . Contraception    Depo  . SEXUALLY TRANSMITTED DISEASE    STD check    Patient reports she has had some vaginal and rectal discomfort and vaginal bleeding for 1 month. Denies discharge, fever, nausea, constipation. No new sexual partner.  See flowsheet for further details and programmatic requirements.    The following portions of the patient's history were reviewed and updated as appropriate: allergies, current medications, past medical history, past social history, past surgical history and problem list.  Objective:   Vitals:   04/22/19 1516  BP: 96/60  Weight: 133 lb (60.3 kg)  Height: 5\' 1"  (1.549 m)    Physical Exam  Well-appearing young woman. Normal effort of breathing, skin warm and dry without lesions. Pelvic exam: external genitalia/labia without lesion, erythema or rash. No inguinal adenopathy. Vagina with scant white discharge without odor. No CMT, uterine enlargement or tenderness, no adnexal tenderness or mass. Rectum without lesion or external hemorrhoid.    Assessment and Plan:  Suzanne Mathews is a 23 y.o. female presenting to the [facility] Department for STI screening  1. Encounter for other contraceptive management Discussed BCM, pt initially desired DMPA, then elected not to get after all. Declines other BCM today. Wet prep WNL. WET PREP FOR TRICH, YEAST, CLUE - Chlamydia/Gonorrhea New Home Lab - Gonococcus culture     Return if symptoms worsen or fail to improve.  No future  appointments.  Lora Havens, PA-C

## 2019-06-12 ENCOUNTER — Other Ambulatory Visit: Payer: Self-pay

## 2019-06-12 ENCOUNTER — Emergency Department
Admission: EM | Admit: 2019-06-12 | Discharge: 2019-06-12 | Disposition: A | Discharge status: Home / Self Care | Payer: Medicaid Other | Attending: Emergency Medicine | Admitting: Emergency Medicine

## 2019-06-12 ENCOUNTER — Encounter: Payer: Self-pay | Admitting: Emergency Medicine

## 2019-06-12 DIAGNOSIS — Z20828 Contact with and (suspected) exposure to other viral communicable diseases: Secondary | ICD-10-CM | POA: Insufficient documentation

## 2019-06-12 DIAGNOSIS — F329 Major depressive disorder, single episode, unspecified: Secondary | ICD-10-CM | POA: Insufficient documentation

## 2019-06-12 DIAGNOSIS — R45851 Suicidal ideations: Secondary | ICD-10-CM | POA: Insufficient documentation

## 2019-06-12 DIAGNOSIS — F4325 Adjustment disorder with mixed disturbance of emotions and conduct: Secondary | ICD-10-CM | POA: Diagnosis present

## 2019-06-12 DIAGNOSIS — F330 Major depressive disorder, recurrent, mild: Secondary | ICD-10-CM

## 2019-06-12 LAB — COMPREHENSIVE METABOLIC PANEL
ALT: 13 U/L (ref 0–44)
AST: 21 U/L (ref 15–41)
Albumin: 4.1 g/dL (ref 3.5–5.0)
Alkaline Phosphatase: 45 U/L (ref 38–126)
Anion gap: 7 (ref 5–15)
BUN: 11 mg/dL (ref 6–20)
CO2: 23 mmol/L (ref 22–32)
Calcium: 9 mg/dL (ref 8.9–10.3)
Chloride: 109 mmol/L (ref 98–111)
Creatinine, Ser: 0.73 mg/dL (ref 0.44–1.00)
GFR calc Af Amer: >60 mL/min (ref >60)
GFR calc non Af Amer: >60 mL/min (ref >60)
Glucose, Bld: 96 mg/dL (ref 70–99)
Potassium: 3.6 mmol/L (ref 3.5–5.1)
Sodium: 139 mmol/L (ref 135–145)
Total Bilirubin: 0.5 mg/dL (ref 0.3–1.2)
Total Protein: 7.7 g/dL (ref 6.5–8.1)

## 2019-06-12 LAB — CBC
HCT: 36.7 % (ref 36.0–46.0)
Hemoglobin: 11.7 g/dL — ABNORMAL LOW (ref 12.0–15.0)
MCH: 24.7 pg — ABNORMAL LOW (ref 26.0–34.0)
MCHC: 31.9 g/dL (ref 30.0–36.0)
MCV: 77.4 fL — ABNORMAL LOW (ref 80.0–100.0)
Platelets: 253 10*3/uL (ref 150–400)
RBC: 4.74 MIL/uL (ref 3.87–5.11)
RDW: 14.4 % (ref 11.5–15.5)
WBC: 6.1 10*3/uL (ref 4.0–10.5)
nRBC: 0 % (ref 0.0–0.2)

## 2019-06-12 LAB — URINE DRUG SCREEN, QUALITATIVE (ARMC ONLY)
Amphetamines, Ur Screen: NOT DETECTED
Barbiturates, Ur Screen: NOT DETECTED
Benzodiazepine, Ur Scrn: NOT DETECTED
Cannabinoid 50 Ng, Ur ~~LOC~~: NOT DETECTED
Cocaine Metabolite,Ur ~~LOC~~: NOT DETECTED
MDMA (Ecstasy)Ur Screen: NOT DETECTED
Methadone Scn, Ur: NOT DETECTED
Opiate, Ur Screen: NOT DETECTED
Phencyclidine (PCP) Ur S: NOT DETECTED
Tricyclic, Ur Screen: NOT DETECTED

## 2019-06-12 LAB — POCT PREGNANCY, URINE: Preg Test, Ur: NEGATIVE

## 2019-06-12 LAB — ETHANOL: Alcohol, Ethyl (B): <10 mg/dL (ref <10)

## 2019-06-12 LAB — SALICYLATE LEVEL: Salicylate Lvl: <7 mg/dL (ref 2.8–30.0)

## 2019-06-12 LAB — ACETAMINOPHEN LEVEL: Acetaminophen (Tylenol), Serum: <10 ug/mL — ABNORMAL LOW (ref 10–30)

## 2019-06-12 LAB — SARS CORONAVIRUS 2 BY RT PCR (HOSPITAL ORDER, PERFORMED IN ~~LOC~~ HOSPITAL LAB): SARS Coronavirus 2: NEGATIVE

## 2019-06-12 NOTE — ED Notes (Signed)
Pt. Introduced to unit.  Pt. Advised of bathroom policy, cameras, and safety checks.  Pt. Requested and was given remote for tv.  Pt. Awaiting to talk to psychiatrist.  Pt. Advised to get some sleep due to [facility] D[facility] running on late side due to high volume.  Pt. Has no further questions or concerns.

## 2019-06-12 NOTE — Consult Note (Signed)
[facility] Psych ED Discharge  06/12/2019 3:00 PM Suzanne Mathews  MRN:  [id number] Principal Problem: Adjustment disorder with mixed disturbance of emotions and conduct Discharge Diagnoses: Principal Problem:   Adjustment disorder with mixed disturbance of emotions and conduct  Subjective: "I'm fine.  I just had a moment."  Patient seen and evaluated in person by this provider.  She is states that she had a "moment" yesterday and feels better today.  Denies suicidal ideations, intent, and plan.  No homicidal ideations, hallucinations, or substance abuse.  She currently lives with her aunt and it is going well according to the patient.  She would like to leave and follow-up with outpatient care, stable psychiatrically at this time.  Her aunt was called by TTS for collateral information and her aunt has no safety concerns.  No guns or weapons in the home.  HPI per TTS:    Suzanne Mathews is an 23 y.o. female. Who presents under involuntary commitment due to depressed mood and SI. Patient states that she is having suicidal thoughts. Patient states that she has a plan to wreck her car. Patient states that she has had a suicide attempt in the past. She state that this included laceration to the wrist. Patient included her families (mom and grandmother) issues with addiction, financial stressors and a recent break up as  identifiable stressors causing her suicidal ideation. . Pt. denies the presence of any auditory or visual hallucinations at this time. Patient denies any current suicidal ideation. Patient denies any other medical complaints. A behavioral health assessment has been completed including evaluation of the patient, collecting collateral history:, reviewing available medical/clinic records, evaluating his unique risk and protective factors, and discussing treatment recommendations.  Total Time spent with patient: 1 hour  Past Psychiatric History: depression  Past Medical History:  Past  Medical History:  Diagnosis Date  . Abscess of tonsil   . Depression 2014  . Pilonidal cyst with abscess 2014    Past Surgical History:  Procedure Laterality Date  . PILONIDAL CYST DRAINAGE  07-28-13  . pilonidal cystectomy  08/02/14   Family History:  Family History  Problem Relation Age of Onset  . Heart disease Maternal Grandmother    Family Psychiatric  History: none Social History:  Social History   Substance and Sexual Activity  Alcohol Use Yes   Comment: occ     Social History   Substance and Sexual Activity  Drug Use No    Social History   Socioeconomic History  . Marital status: Single    Spouse name: Not on file  . Number of children: Not on file  . Years of education: Not on file  . Highest education level: Not on file  Occupational History  . Not on file  Social Needs  . Financial resource strain: Not on file  . Food insecurity    Worry: Not on file    Inability: Not on file  . Transportation needs    Medical: Not on file    Non-medical: Not on file  Tobacco Use  . Smoking status: Never Smoker  . Smokeless tobacco: Never Used  Substance and Sexual Activity  . Alcohol use: Yes    Comment: occ  . Drug use: No  . Sexual activity: Not on file  Lifestyle  . Physical activity    Days per week: Not on file    Minutes per session: Not on file  . Stress: Not on file  Relationships  . Social connections  Talks on phone: Not on file    Gets together: Not on file    Attends religious service: Not on file    Active member of club or organization: Not on file    Attends meetings of clubs or organizations: Not on file    Relationship status: Not on file  Other Topics Concern  . Not on file  Social History Narrative  . Not on file    Has this patient used any form of tobacco in the last 30 days? (Cigarettes, Smokeless Tobacco, Cigars, and/or Pipes) NA  Current Medications: No current facility-administered medications for this encounter.     Current Outpatient Medications  Medication Sig Dispense Refill  . brompheniramine-pseudoephedrine-DM 30-2-10 MG/5ML syrup Take 5 mLs by mouth 4 (four) times daily as needed. Mix with 5 mL of viscous lidocaine for swish and swallow. (Patient taking differently: Take 5 mLs by mouth 4 (four) times daily as needed (mouth/ throat pain). Mix with 5 mL of viscous lidocaine for swish and swallow.) 120 mL 0  . ibuprofen (ADVIL,MOTRIN) 600 MG tablet Take 1 tablet (600 mg total) by mouth every 8 (eight) hours as needed. (Patient taking differently: Take 600 mg by mouth every 8 (eight) hours as needed for fever or mild pain. ) 15 tablet 0  . lidocaine (XYLOCAINE) 2 % solution Use as directed 5 mLs in the mouth or throat every 6 (six) hours as needed for mouth pain. Mix with 5 mL of Bromfed-DM for swish and swallow. 100 mL 0  . metroNIDAZOLE (FLAGYL) 500 MG tablet Take 1 tablet (500 mg total) by mouth 2 (two) times daily. 14 tablet 0  . predniSONE (STERAPRED UNI-PAK 21 TAB) 10 MG (21) TBPK tablet 6 tabs day 1 and taper 10 mg a day - 6 days 21 tablet 0   PTA Medications: (Not in a hospital admission)   Musculoskeletal: Strength & Muscle Tone: within normal limits Gait & Station: normal Patient leans: N/A  Psychiatric Specialty Exam: Physical Exam  Nursing note and vitals reviewed. Constitutional: She is oriented to person, place, and time. She appears well-developed and well-nourished.  HENT:  Head: Normocephalic.  Neck: Normal range of motion.  Respiratory: Effort normal.  Musculoskeletal: Normal range of motion.  Neurological: She is alert and oriented to person, place, and time.  Psychiatric: Her speech is normal and behavior is normal. Judgment and thought content normal. Cognition and memory are normal. She exhibits a depressed mood.    Review of Systems  Psychiatric/Behavioral: Positive for depression.  All other systems reviewed and are negative.   Blood pressure 107/67, pulse 69,  temperature 97.7 F (36.5 C), temperature source Oral, resp. rate 16, height 5\' 2"  (1.575 m), weight 59 kg, SpO2 100 %.Body mass index is 23.78 kg/m.  General Appearance: Casual  Eye Contact:  Good  Speech:  Normal Rate  Volume:  Normal  Mood:  Depressed  Affect:  Congruent  Thought Process:  Coherent and Descriptions of Associations: Intact  Orientation:  Full (Time, Place, and Person)  Thought Content:  WDL and Logical  Suicidal Thoughts:  No  Homicidal Thoughts:  No  Memory:  Immediate;   Good Recent;   Good Remote;   Good  Judgement:  Good  Insight:  Good  Psychomotor Activity:  Normal  Concentration:  Concentration: Good and Attention Span: Good  Recall:  Good  Fund of Knowledge:  Good  Language:  Good  Akathisia:  No  Handed:  Right  AIMS (if indicated):  Assets:  Housing Leisure Time Physical Health Resilience Social Support Vocational/Educational  ADL's:  Intact  Cognition:  WNL  Sleep:      23 year old female who came to the hospital for depression with suicidal ideations with a plan to wreck her car.  She reports today she was upset at the time and no longer feels this way.  Reports a low level of depression with no suicidal/homicidal ideations.  No threat to self at this time.  She lives with her aunt and her aunt has no safety concerns eating.  Agreeable to follow-up outpatient.  Demographic Factors:  Adolescent or young adult  Loss Factors: NA  Historical Factors: NA  Risk Reduction Factors:   Sense of responsibility to family, Living with another person, especially a relative and Positive social support  Continued Clinical Symptoms:  Depression, mild  Cognitive Features That Contribute To Risk:  None    Suicide Risk:  Minimal: No identifiable suicidal ideation.  Patients presenting with no risk factors but with morbid ruminations; may be classified as minimal risk based on the severity of the depressive symptoms   Plan Of Care/Follow-up  recommendations:  Major depressive disorder, recurrent, mild: -Outpatient resources provided Activity:  as tolerated Diet:  heart healthy diet  Disposition: discharge home  Nanine MeansJamison Caterine Mcmeans, NP 06/12/2019, 3:00 PM

## 2019-06-12 NOTE — BH Assessment (Signed)
TTS contacted pt's aunt Deirdre Priest: [phone]) to obtain collateral information. She reports "This all came as a big surprise to me. I was surprised she was having thoughts of harming herself. She moved in with me in September and she's been fine." Pt's aunt denied having any concerns for pt's safety if patient were to be discharged. She appeared to be supportive of patient receiving outpatient mental health treatment to address her stressors.

## 2019-06-12 NOTE — ED Notes (Signed)
Patient changed into hospital scrubs. Patient with 4 ear rings, 2 nose rings, bracelet, black tennis shoes, socks, sweat pants, sweat shirt, cell phone, licence. Keys, t-shirt, underwear and bra.

## 2019-06-12 NOTE — BH Assessment (Signed)
Assessment Note  Suzanne Mathews is an 23 y.o. female. Who presents under involuntary commitment due to depressed mood and SI. Patient states that she is having suicidal thoughts. Patient states that she has a plan to wreck her car. Patient states that she has had a suicide attempt in the past. She state that this included laceration to the wrist. Patient included her families (mom and grandmother) issues with addiction, financial stressors and a recent break up as  identifiable stressors causing her suicidal ideation. . Pt. denies the presence of any auditory or visual hallucinations at this time. Patient denies any current suicidal ideation.  Patient denies any other medical complaints. A behavioral health assessment has been completed including evaluation of the patient, collecting collateral history:, reviewing available medical/clinic records, evaluating his unique risk and protective factors, and discussing treatment recommendations.          Diagnosis: Major Depressive Disorder   Past Medical History:  Past Medical History:  Diagnosis Date  . Abscess of tonsil   . Depression 2014  . Pilonidal cyst with abscess 2014    Past Surgical History:  Procedure Laterality Date  . PILONIDAL CYST DRAINAGE  07-28-13  . pilonidal cystectomy  08/02/14    Family History:  Family History  Problem Relation Age of Onset  . Heart disease Maternal Grandmother     Social History:  reports that she has never smoked. She has never used smokeless tobacco. She reports current alcohol use. She reports that she does not use drugs.  Additional Social History:  Alcohol / Drug Use Pain Medications: SEE MAR Prescriptions: SEE MAR Over the Counter: SEE MAR History of alcohol / drug use?: No history of alcohol / drug abuse  CIWA: CIWA-Ar BP: 125/71 Pulse Rate: 76 COWS:    Allergies: No Known Allergies  Home Medications: (Not in a hospital admission)   OB/GYN Status:  No LMP recorded. Patient  has had an injection.  General Assessment Data TTS Assessment: In system Is this a Tele or Face-to-Face Assessment?: Tele Assessment Is this an Initial Assessment or a Re-assessment for this encounter?: Initial Assessment Language Other than English: No Living Arrangements: Other (Comment) What gender do you identify as?: Female Living Arrangements: Other relatives Can pt return to current living arrangement?: Yes Admission Status: Voluntary Is patient capable of signing voluntary admission?: Yes Referral Source: Self/Family/Friend Insurance type: None   Medical Screening Exam [facility] Walk-in ONLY) Medical Exam completed: Yes  Crisis Care Plan Living Arrangements: Other relatives Legal Guardian: Other:(Self) Name of Psychiatrist: none Name of Therapist: none  Education Status Is patient currently in school?: No Is the patient employed, unemployed or receiving disability?: Employed  Risk to self with the past 6 months Suicidal Ideation: Yes-Currently Present Has patient been a risk to self within the past 6 months prior to admission? : Yes Suicidal Intent: Yes-Currently Present Has patient had any suicidal intent within the past 6 months prior to admission? : Yes Is patient at risk for suicide?: Yes Suicidal Plan?: Yes-Currently Present Has patient had any suicidal plan within the past 6 months prior to admission? : Yes Specify Current Suicidal Plan: Crash her care Access to Means: Yes What has been your use of drugs/alcohol within the last 12 months?: none Previous Attempts/Gestures: Yes How many times?: 2 Triggers for Past Attempts: Family contact, Spouse contact Intentional Self Injurious Behavior: None Family Suicide History: No Recent stressful life event(s): Conflict (Comment), Loss (Comment), Financial Problems, Trauma (Comment) Persecutory voices/beliefs?: No Depression: Yes Depression  Symptoms: Feeling worthless/self pity, Loss of interest in usual pleasures,  Fatigue, Isolating, Tearfulness Substance abuse history and/or treatment for substance abuse?: No Suicide prevention information given to non-admitted patients: Not applicable  Risk to Others within the past 6 months Homicidal Ideation: No Does patient have any lifetime risk of violence toward others beyond the six months prior to admission? : No Thoughts of Harm to Others: No Current Homicidal Intent: No Current Homicidal Plan: No Access to Homicidal Means: No History of harm to others?: No Assessment of Violence: None Noted Does patient have access to weapons?: No Criminal Charges Pending?: No Does patient have a court date: No Is patient on probation?: No  Psychosis Hallucinations: None noted Delusions: None noted  Mental Status Report Appearance/Hygiene: Layered clothes Eye Contact: Poor Motor Activity: Freedom of movement Speech: Logical/coherent Level of Consciousness: Crying Mood: Depressed, Anxious Affect: Anxious, Sad, Depressed Anxiety Level: Moderate Thought Processes: Relevant Judgement: Partial Orientation: Person, Place, Time, Situation Obsessive Compulsive Thoughts/Behaviors: None  Cognitive Functioning Concentration: Good Memory: Remote Intact, Recent Intact Is patient IDD: No Insight: Poor Impulse Control: Fair Appetite: Poor Have you had any weight changes? : No Change Sleep: No Change Total Hours of Sleep: 6 Vegetative Symptoms: None  ADLScreening [facility] Assessment Services) Patient's cognitive ability adequate to safely complete daily activities?: Yes Patient able to express need for assistance with ADLs?: Yes Independently performs ADLs?: Yes (appropriate for developmental age)  Prior Inpatient Therapy Prior Inpatient Therapy: No  Prior Outpatient Therapy Prior Outpatient Therapy: No Does patient have an ACCT team?: No Does patient have Intensive In-House Services?  : No Does patient have [facility] services? : No Does patient have P4CC  services?: No  ADL Screening (condition at time of admission) Patient's cognitive ability adequate to safely complete daily activities?: Yes Patient able to express need for assistance with ADLs?: Yes Independently performs ADLs?: Yes (appropriate for developmental age)             Advance Directives (For Healthcare) Does Patient Have a Medical Advance Directive?: No          Disposition:  Disposition Initial Assessment Completed for this Encounter: Yes  On Site Evaluation by:   Reviewed with Physician:    Laretta Alstrom 06/12/2019 3:25 AM

## 2019-06-12 NOTE — ED Triage Notes (Signed)
Patient states that she is having suicidal thoughts. Patient states that she has a plan to wreck her car. Patient states that she has had a suicide attempt in the past.

## 2019-06-12 NOTE — ED Notes (Signed)
Pt given lunch tray.

## 2019-06-12 NOTE — ED Notes (Signed)
Pt discharged home with her sister. VS stable. Pt denies SI. All belongings returned to patient (including keys and cell phone).  Discharge instructions reviewed with patient. Pt signed for her discharge.

## 2019-06-12 NOTE — ED Notes (Signed)
This EDT was asked by psychiatrist to retrieve cell phone from pt belongings bag, Cell phone was retrieved and phone numbers were retrieved off of it. Phone left at nurse station at this time.

## 2019-06-12 NOTE — ED Provider Notes (Signed)
3:35 PM Patient has been seen and evaluated by psychiatry team and deemed appropriate for discharge. Patient determined not to be a threat to themselves or others. IVC rescinded by psychiatry team. Patient is otherwise medically clear.   Will plan for discharge with outpatient follow up.     Lilia Pro., MD 06/12/19 1536

## 2019-06-12 NOTE — ED Notes (Signed)
Meal tray placed in room 

## 2019-06-12 NOTE — Discharge Instructions (Addendum)
[facility] (M[facility] Health & [facility]stance Use [facility]) & [facility]  Mental health service in [place], [place] Address: [address], [place], [place] [address] Hours:  Closed ? Opens 8AM Mon Phone: [phone] 
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
## 2019-07-26 NOTE — Addendum Note (Signed)
Addended by: Caryl Bis on: 07/26/2019 04:48 PM   Modules accepted: Orders

## 2019-10-19 ENCOUNTER — Ambulatory Visit: Payer: Medicaid Other

## 2019-11-06 ENCOUNTER — Emergency Department
Admission: EM | Admit: 2019-11-06 | Discharge: 2019-11-06 | Disposition: A | Discharge status: Home / Self Care | Payer: Self-pay | Attending: Emergency Medicine | Admitting: Emergency Medicine

## 2019-11-06 ENCOUNTER — Other Ambulatory Visit: Payer: Self-pay

## 2019-11-06 ENCOUNTER — Emergency Department: Payer: Self-pay

## 2019-11-06 ENCOUNTER — Encounter: Payer: Self-pay | Admitting: Emergency Medicine

## 2019-11-06 DIAGNOSIS — K0889 Other specified disorders of teeth and supporting structures: Secondary | ICD-10-CM | POA: Insufficient documentation

## 2019-11-06 DIAGNOSIS — Y9389 Activity, other specified: Secondary | ICD-10-CM | POA: Insufficient documentation

## 2019-11-06 DIAGNOSIS — Y9289 Other specified places as the place of occurrence of the external cause: Secondary | ICD-10-CM | POA: Insufficient documentation

## 2019-11-06 DIAGNOSIS — S80211A Abrasion, right knee, initial encounter: Secondary | ICD-10-CM | POA: Insufficient documentation

## 2019-11-06 DIAGNOSIS — S022XXA Fracture of nasal bones, initial encounter for closed fracture: Secondary | ICD-10-CM | POA: Insufficient documentation

## 2019-11-06 DIAGNOSIS — Y998 Other external cause status: Secondary | ICD-10-CM | POA: Insufficient documentation

## 2019-11-06 MED ORDER — IBUPROFEN 800 MG PO TABS
800.0000 mg | ORAL_TABLET | Freq: Once | ORAL | Status: AC
  Administered 2019-11-06: 800 mg via ORAL
  Filled 2019-11-06: qty 1

## 2019-11-06 MED ORDER — IBUPROFEN 800 MG PO TABS: 800.0000 mg | ORAL_TABLET | Freq: Three times a day (TID) | ORAL | 0 refills | Status: AC | PRN

## 2019-11-06 NOTE — ED Notes (Signed)
Pt presents in NAD, no active bleeding, swelling to bridge of nose
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
## 2019-11-06 NOTE — Discharge Instructions (Addendum)
OPTIONS FOR DENTAL FOLLOW UP CARE ° °[place] Department of Health and Human Services - Local Safety Net Dental Clinics °http://www.ncdhhs.gov/dph/oralhealth/services/safetynetclinics.htm °  °[facility] (336-562-3123) ° °[facility] [place] ([phone]) ° °[facility] ([phone] ext [phone]) ° °[facility] ([phone]) ° °[facility] ([phone]) °This clinic caters to the indigent population and is on a lottery system. °Location: °[facility], [address], [place] °Clinic Hours: °Wednesdays from 6pm - 9pm, patients seen by a lottery system. °For dates, call or go to [email or web address].[facility].edu/[facility]/patients/Dental-[facility] °Services: °Cleanings, fillings and simple extractions. °Payment Options: °DENTAL WORK IS FREE OF CHARGE. Bring proof of income or support. °Best way to get seen: °Arrive at 5:15 pm - this is a lottery, NOT first come/first serve, so arriving earlier will not increase your chances of being seen. °  °  °[facility] °[phone] °Select option 1 for emergencies °  °Location: °[facility], [address], [place] °Clinic Hours: °No walk-ins accepted - call the day before to schedule an appointment. °Check in times are 9:30 am and 1:30 pm. °Services: °Simple extractions, temporary fillings, pulpectomy/pulp debridement, uncomplicated abscess drainage. °Payment Options: °PAYMENT IS DUE AT THE TIME OF SERVICE.  Fee is usually $100-200, additional surgical procedures (e.g. abscess drainage) may be extra. °Cash, checks, Visa/MasterCard accepted.  Can file Medicaid if patient is covered for dental - patient should call case worker to check. °No discount for [facility] patients. °Best way to get seen: °MUST call the day before and get onto the schedule. Can usually be seen the next 1-2 days. No walk-ins accepted. °  °  °[facility] °[phone] °   °Location: °[facility], [address], [place] °Clinic Hours: °M, W, Th, F 8am or 1:30pm, Tues 9a or 1:30 - first come/first served. °Services: °Simple extractions, temporary fillings, uncomplicated abscess drainage.  You do not need to be an [place] resident. °Payment Options: °PAYMENT IS DUE AT THE TIME OF SERVICE. °Dental insurance, otherwise sliding scale - bring proof of income or support. °Depending on income and treatment needed, cost is usually $50-200. °Best way to get seen: °Arrive early as it is first come/first served. °  °  °[facility] °[phone] °  °Location: °[address] °Clinic Hours: °Mon-Thu 8a-5p °Services: °Most basic dental services including extractions and fillings. °Payment Options: °PAYMENT IS DUE AT THE TIME OF SERVICE. °Sliding scale, up to 50% off - bring proof if income or support. °Medicaid with dental option accepted. °Best way to get seen: °Call to schedule an appointment, can usually be seen within 2 weeks OR they will try to see walk-ins - show up at 8a or 2p (you may have to wait). °  °  °[facility] °919-245-2435 °[place] RESIDENTS ONLY °  °Location: °[facility], [address], [place], [place] [address] °Clinic Hours: By appointment only. °Monday - Thursday 8am-5pm, Friday 8am-12pm °Services: Cleanings, fillings, extractions. °Payment Options: °PAYMENT IS DUE AT THE TIME OF SERVICE. °Cash, Visa or MasterCard. Sliding scale - $30 minimum per service. °Best way to get seen: °Come in to office, complete packet and make an appointment - need proof of income °or support monies for each household member and proof of [place] residence. °Usually takes about a month to get in. °  °  °[facility] °[phone] °  °Location: °[address]   [place] °Clinic Hours: Walk-in Urgent Care Dental Services are offered Monday-Friday  mornings only. °The numbers of emergencies accepted daily is limited to the number of °providers available. °Maximum 15 - Mondays, Wednesdays & Thursdays °Maximum 10 - Tuesdays & Fridays °Services: °You do not need to be a  [place] resident to be seen for a dental emergency. °Emergencies are defined as pain, swelling, abnormal bleeding, or dental trauma. Walkins will receive x-rays if needed. °NOTE: Dental cleaning is not an emergency. °Payment Options: °PAYMENT IS DUE AT THE TIME OF SERVICE. °Minimum co-pay is $40.00 for uninsured patients. °Minimum co-pay is $3.00 for Medicaid with dental coverage. °Dental Insurance is accepted and must be presented at time of visit. °Medicare does not cover dental. °Forms of payment: Cash, credit card, checks. °Best way to get seen: °If not previously registered with the clinic, walk-in dental registration begins at 7:15 am and is on a first come/first serve basis. °If previously registered with the clinic, call to make an appointment. °  °  °[facility] °[phone] °[place] RESIDENTS ONLY °  °Location: °[address], [place], [place] °Clinic Hours: °Mon-Thu 10a-2p °Services: Extractions only! °Payment Options: °FREE (donations accepted) - bring proof of income or support °Best way to get seen: °Call and schedule an appointment OR come at 8am on the 1st Monday of every month (except for holidays) when it is first come/first served. °  °  °[facility] °[phone] °  °Location: °[address], [place] °Clinic Hours: °Friday mornings °Services, Payment Options, Best way to get seen: °Call for info °

## 2019-12-08 ENCOUNTER — Ambulatory Visit: Payer: Medicaid Other

## 2020-04-11 ENCOUNTER — Other Ambulatory Visit: Payer: Self-pay

## 2020-04-11 DIAGNOSIS — N72 Inflammatory disease of cervix uteri: Secondary | ICD-10-CM | POA: Insufficient documentation

## 2020-04-11 DIAGNOSIS — N3090 Cystitis, unspecified without hematuria: Secondary | ICD-10-CM | POA: Insufficient documentation

## 2020-04-11 DIAGNOSIS — N898 Other specified noninflammatory disorders of vagina: Secondary | ICD-10-CM | POA: Insufficient documentation

## 2020-04-11 DIAGNOSIS — Z79899 Other long term (current) drug therapy: Secondary | ICD-10-CM | POA: Insufficient documentation

## 2020-04-11 DIAGNOSIS — L739 Follicular disorder, unspecified: Secondary | ICD-10-CM | POA: Insufficient documentation

## 2020-04-11 DIAGNOSIS — Z202 Contact with and (suspected) exposure to infections with a predominantly sexual mode of transmission: Secondary | ICD-10-CM | POA: Insufficient documentation

## 2020-04-11 NOTE — ED Triage Notes (Signed)
Pt state vaginal itching and yellow discharge this week. Pt also complains of "weird bumps" like a pimple on vagina.

## 2020-04-12 ENCOUNTER — Emergency Department
Admission: EM | Admit: 2020-04-12 | Discharge: 2020-04-12 | Disposition: A | Discharge status: Home / Self Care | Payer: Medicaid Other | Attending: Emergency Medicine | Admitting: Emergency Medicine

## 2020-04-12 DIAGNOSIS — N390 Urinary tract infection, site not specified: Secondary | ICD-10-CM

## 2020-04-12 DIAGNOSIS — N898 Other specified noninflammatory disorders of vagina: Secondary | ICD-10-CM

## 2020-04-12 DIAGNOSIS — N72 Inflammatory disease of cervix uteri: Secondary | ICD-10-CM

## 2020-04-12 DIAGNOSIS — L739 Follicular disorder, unspecified: Secondary | ICD-10-CM

## 2020-04-12 DIAGNOSIS — Z202 Contact with and (suspected) exposure to infections with a predominantly sexual mode of transmission: Secondary | ICD-10-CM

## 2020-04-12 DIAGNOSIS — N760 Acute vaginitis: Secondary | ICD-10-CM

## 2020-04-12 LAB — URINALYSIS, COMPLETE (UACMP) WITH MICROSCOPIC
Bilirubin Urine: NEGATIVE
Glucose, UA: NEGATIVE mg/dL
Ketones, ur: NEGATIVE mg/dL
Nitrite: POSITIVE — AB
Protein, ur: NEGATIVE mg/dL
Specific Gravity, Urine: 1.03 (ref 1.005–1.030)
pH: 6 (ref 5.0–8.0)

## 2020-04-12 LAB — CHLAMYDIA/NGC RT PCR (ARMC ONLY)
Chlamydia Tr: NOT DETECTED
N gonorrhoeae: NOT DETECTED

## 2020-04-12 LAB — POCT PREGNANCY, URINE: Preg Test, Ur: NEGATIVE

## 2020-04-12 LAB — WET PREP, GENITAL
Sperm: NONE SEEN
Trich, Wet Prep: NONE SEEN
Yeast Wet Prep HPF POC: NONE SEEN

## 2020-04-12 MED ORDER — DOXYCYCLINE HYCLATE 100 MG PO TABS
100.0000 mg | ORAL_TABLET | Freq: Once | ORAL | Status: AC
  Administered 2020-04-12: 100 mg via ORAL
  Filled 2020-04-12: qty 1

## 2020-04-12 MED ORDER — DOXYCYCLINE HYCLATE 100 MG PO CAPS: 100.0000 mg | ORAL_CAPSULE | Freq: Two times a day (BID) | ORAL | 0 refills | Status: AC

## 2020-04-12 MED ORDER — CEFTRIAXONE SODIUM 1 G IJ SOLR
500.0000 mg | Freq: Once | INTRAMUSCULAR | Status: AC
  Administered 2020-04-12: 500 mg via INTRAMUSCULAR
  Filled 2020-04-12: qty 10

## 2020-04-12 MED ORDER — METRONIDAZOLE 500 MG PO TABS
500.0000 mg | ORAL_TABLET | Freq: Once | ORAL | Status: AC
  Administered 2020-04-12: 500 mg via ORAL
  Filled 2020-04-12: qty 1

## 2020-04-12 MED ORDER — METRONIDAZOLE 500 MG PO TABS: 500.0000 mg | ORAL_TABLET | Freq: Two times a day (BID) | ORAL | 0 refills | Status: DC

## 2020-04-12 MED ORDER — CEPHALEXIN 500 MG PO CAPS: 500.0000 mg | ORAL_CAPSULE | Freq: Two times a day (BID) | ORAL | 0 refills | Status: AC

## 2020-04-12 NOTE — Discharge Instructions (Signed)
As we discussed, we are treating you for sexually transmitted diseases given your probable exposure.  Please take the full course of the 2 antibiotics prescribed (do not stop taking the medications early).  Please remember that although we are treating you, if you have sex with an infected partner, you can easily get reinfected.  Your partner needs to be fully treated as well before you resume unprotected sex.  Please follow-up at the [facility] department to discuss your treatment and if you need any additional testing.  Remember that you can check MyChart for the rest of your results and they should be available within the next few hours.    Return to the emergency department if you develop new or worsening symptoms that concern you.

## 2020-04-12 NOTE — ED Notes (Signed)
Report to ashley, rn

## 2020-04-12 NOTE — ED Provider Notes (Signed)
[facility] [place] Emergency Department Provider Note  ____________________________________________   First MD Initiated Contact with Patient 04/12/20 [phone]     (approximate)  I have reviewed the triage vital signs and the nursing notes.   HISTORY  Chief Complaint Vaginal Itching    HPI Suzanne Mathews is a 24 y.o. female who presents for evaluation of increased vaginal discharge, pain with sexual intercourse, vaginal itching, and some bumps around her genital area.  She reports that she has had the increased discharge, itching, and pain for about a week.  She has 1 partner with whom she is monogamous and does not use condoms but she says that she has been told by multiple people that he is cheating on her.  She last had her menstrual period about a month ago.  She has a little bit of burning and discomfort when she urinates but it may be irritation on the outside.  She denies fever, nausea, vomiting, abdominal pain.  Nothing in particular makes his symptoms better and sexual intercourse make them worse.         Past Medical History:  Diagnosis Date  . Abscess of tonsil   . Depression 2014  . Pilonidal cyst with abscess 2014    Patient Active Problem List   Diagnosis Date Noted  . 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 Peritonsillar abscess 11/01/2018  . Pilonidal cyst 09/13/2014  . Pilonidal cyst with abscess 07/30/2013    Past Surgical History:  Procedure Laterality Date  . PILONIDAL CYST DRAINAGE  07-28-13  . pilonidal cystectomy  08/02/14    Prior to Admission medications   Medication Sig Start Date End Date Taking? Authorizing Provider  cephALEXin (KEFLEX) 500 MG capsule Take 1 capsule (500 mg total) by mouth 2 (two) times daily. 04/12/20   Loleta Rose, MD  doxycycline (VIBRAMYCIN) 100 MG capsule Take 1 capsule (100 mg total) by mouth 2 (two) times daily for 10 days. 04/12/20 04/22/20  Loleta Rose, MD   ibuprofen (ADVIL) 800 MG tablet Take 1 tablet (800 mg total) by mouth every 8 (eight) hours as needed. 11/06/19   Nita Sickle, MD  metroNIDAZOLE (FLAGYL) 500 MG tablet Take 1 tablet (500 mg total) by mouth 2 (two) times daily. 04/12/20   Loleta Rose, MD    Allergies Patient has no known allergies.  Family History  Problem Relation Age of Onset  . Heart disease Maternal Grandmother     Social History Social History   Tobacco Use  . Smoking status: Never Smoker  . Smokeless tobacco: Never Used  Vaping Use  . Vaping Use: Never used  Substance Use Topics  . Alcohol use: Yes    Comment: occ  . Drug use: No    Review of Systems Constitutional: No fever/chills Respiratory: Denies shortness of breath. Gastrointestinal: No abdominal pain.  No nausea, no vomiting.  No diarrhea.  No constipation. Genitourinary: Vaginal itching, increased vaginal discharge, dyspareunia. Musculoskeletal: Negative for neck pain.  Negative for back pain. Integumentary: Itching bumps on her mons pubis. Neurological: Negative for headaches, focal weakness or numbness.   ____________________________________________   PHYSICAL EXAM:  VITAL SIGNS: ED Triage Vitals  Enc Vitals Group     BP 04/11/20 1952 123/74     Pulse Rate 04/11/20 1952 76     Resp 04/11/20 1952 18     Temp 04/11/20 1952 98.4 F (36.9 C)     Temp Source 04/11/20 1952 Oral  SpO2 04/11/20 1952 100 %     Weight 04/11/20 1952 61.2 kg (135 lb)     Height 04/11/20 1952 1.575 m (5\' 2" )     Head Circumference --      Peak Flow --      Pain Score 04/11/20 1956 7     Pain Loc --      Pain Edu? --      Excl. in GC? --     Constitutional: Alert and oriented.  Eyes: Conjunctivae are normal.  Head: Atraumatic. Nose: No congestion/rhinnorhea. Mouth/Throat: Patient is wearing a mask. Neck: No stridor.  No meningeal signs.   Cardiovascular: Normal rate, regular rhythm. Good peripheral circulation. Grossly normal heart  sounds. Respiratory: Normal respiratory effort.  No retractions. Gastrointestinal: Soft and nontender. No distention.  Genitourinary: Patient shaves her pubic hair and she has multiple small lesions on the mons pubis that appear consistent with very small cutaneous abscesses or a mild folliculitis.  There are no drainable fluid collections and they are not vesicular and not painful to the touch.  These are only present on the mons pubis, not on the vulva.  There is a copious amount of yellowish-white discharge in the vagina.  Her cervix is mostly normal in appearance but she has a small area of what appears to be friable tissue and cervicitis.  However the exam is not painful for her as she has no cervical motion tenderness on bimanual exam.  ED nurse April was present as chaperone throughout the exam. Musculoskeletal: No lower extremity tenderness nor edema. No gross deformities of extremities. Neurologic:  Normal speech and language. No gross focal neurologic deficits are appreciated.  Skin:  Skin is warm, dry and intact. Psychiatric: Mood and affect are normal. Speech and behavior are normal.  ____________________________________________   LABS (all labs ordered are listed, but only abnormal results are displayed)  Labs Reviewed  WET PREP, GENITAL - Abnormal; Notable for the following components:      Result Value   Clue Cells Wet Prep HPF POC PRESENT (*)    WBC, Wet Prep HPF POC MANY (*)    All other components within normal limits  URINALYSIS, COMPLETE (UACMP) WITH MICROSCOPIC - Abnormal; Notable for the following components:   Color, Urine YELLOW (*)    APPearance CLOUDY (*)    Hgb urine dipstick SMALL (*)    Nitrite POSITIVE (*)    Leukocytes,Ua TRACE (*)    Bacteria, UA MANY (*)    All other components within normal limits  CHLAMYDIA/NGC RT PCR ([facility] ONLY)  URINE CULTURE  POCT PREGNANCY, URINE   ____________________________________________  EKG  No indication for  emergent EKG ____________________________________________  RADIOLOGY May, personally viewed and evaluated these images (plain radiographs) as part of my medical decision making, as well as reviewing the written report by the radiologist.  ED MD interpretation: No indication for emergent imaging  Official radiology report(s): No results found.  ____________________________________________   PROCEDURES   Procedure(s) performed (including Critical Care):  Procedures   ____________________________________________   INITIAL IMPRESSION / MDM / ASSESSMENT AND PLAN / ED COURSE  As part of my medical decision making, I reviewed the following data within the electronic MEDICAL RECORD NUMBER Nursing notes reviewed and incorporated, Labs reviewed , Old chart reviewed and Notes from prior ED visits   Patient presents with signs symptoms concerning for STD and with what she believes is a known STD exposure.  However systemically she has no signs or symptoms  and her vital signs are stable.  She has no tenderness to palpation of the abdomen.  Given the probable exposure, mild cervicitis, increased vaginal discharge, dyspareunia, and vaginal itching, I will treat empirically after I discussed with her the various options.  We are administering ceftriaxone 500 mg intramuscular and I gave her a dose of doxycycline 100 mg by mouth and metronidazole 500 mg by mouth.  I will treat her with a course of doxycycline and metronidazole as described below as well as with a course of Keflex for her nitrate positive UTI.  I had my usual and customary STD discussion with the patient and she knows to check my chart for the definitive results of the gonorrhea and chlamydia test.  She also understands that she can be reinfected by having unprotected sex again with an infected partner.  The lesions on her mons pubis appear most consistent with folliculitis and I counseled her to not shave again at least until the  lesions have improved and the doxycycline should help as well.  I gave my usual and customary return precautions.          ____________________________________________  FINAL CLINICAL IMPRESSION(S) / ED DIAGNOSES  Final diagnoses:  Cervicitis  Vaginal discharge  STD exposure  Folliculitis  Urinary tract infection without hematuria, site unspecified  Bacterial vaginosis     MEDICATIONS GIVEN DURING THIS VISIT:  Medications  cefTRIAXone (ROCEPHIN) injection 500 mg (500 mg Intramuscular Given 04/12/20 0634)  doxycycline (VIBRA-TABS) tablet 100 mg (100 mg Oral Given 04/12/20 0635)  metroNIDAZOLE (FLAGYL) tablet 500 mg (500 mg Oral Given 04/12/20 9211)     ED Discharge Orders         Ordered    doxycycline (VIBRAMYCIN) 100 MG capsule  2 times daily        04/12/20 0631    cephALEXin (KEFLEX) 500 MG capsule  2 times daily        04/12/20 0631    metroNIDAZOLE (FLAGYL) 500 MG tablet  2 times daily        04/12/20 0631          *Please note:  Randol Kern was evaluated in Emergency Department on 04/12/2020 for the symptoms described in the history of present illness. She was evaluated in the context of the global COVID-19 pandemic, which necessitated consideration that the patient might be at risk for infection with the SARS-CoV-2 virus that causes COVID-19. Institutional protocols and algorithms that pertain to the evaluation of patients at risk for COVID-19 are in a state of rapid change based on information released by regulatory bodies including the CDC and federal and state organizations. These policies and algorithms were followed during the patient's care in the ED.  Some ED evaluations and interventions may be delayed as a result of limited staffing during and after the pandemic.*  Note:  This document was prepared using Dragon voice recognition software and may include unintentional dictation errors.   Loleta Rose, MD 04/12/20 [phone]

## 2020-04-12 NOTE — ED Notes (Signed)
No reaction noted to injection site.  

## 2020-04-14 LAB — URINE CULTURE
Culture: >=100000 — AB
Special Requests: NORMAL
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
## 2020-08-14 ENCOUNTER — Emergency Department
Admission: EM | Admit: 2020-08-14 | Discharge: 2020-08-14 | Disposition: A | Discharge status: Home / Self Care | Payer: HRSA Program | Attending: Student in an Organized Health Care Education/Training Program | Admitting: Student in an Organized Health Care Education/Training Program

## 2020-08-14 ENCOUNTER — Other Ambulatory Visit: Payer: Self-pay

## 2020-08-14 DIAGNOSIS — U071 COVID-19: Secondary | ICD-10-CM | POA: Diagnosis not present

## 2020-08-14 DIAGNOSIS — Z20822 Contact with and (suspected) exposure to covid-19: Secondary | ICD-10-CM

## 2020-08-14 DIAGNOSIS — R519 Headache, unspecified: Secondary | ICD-10-CM | POA: Diagnosis present

## 2020-08-14 NOTE — ED Provider Notes (Signed)
[facility] Emergency Department Provider Note  ____________________________________________  Time seen: Approximately 2:40 PM  I have reviewed the triage vital signs and the nursing notes.   HISTORY  Chief Complaint URI    HPI Suzanne Mathews is a 25 y.o. female that presents to emergency department for evaluation of headache, body aches, nonproductive cough for 5 days.  Patient states that her entire family tested positive for Covid on Christmas and she was with them.  Her girlfriend who lives with her also has tested positive for Covid.  Patient needs a Covid test so that she can stay out of work.  No shortness of breath, chest pain, vomiting, diarrhea.  Past Medical History:  Diagnosis Date  . Abscess of tonsil   . Depression 2014  . Pilonidal cyst with abscess 2014    Patient Active Problem List   Diagnosis Date Noted  . 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 Peritonsillar abscess 11/01/2018  . Pilonidal cyst 09/13/2014  . Pilonidal cyst with abscess 07/30/2013    Past Surgical History:  Procedure Laterality Date  . PILONIDAL CYST DRAINAGE  07-28-13  . pilonidal cystectomy  08/02/14    Prior to Admission medications   Medication Sig Start Date End Date Taking? Authorizing Provider  cephALEXin (KEFLEX) 500 MG capsule Take 1 capsule (500 mg total) by mouth 2 (two) times daily. 04/12/20   Loleta Rose, MD  ibuprofen (ADVIL) 800 MG tablet Take 1 tablet (800 mg total) by mouth every 8 (eight) hours as needed. 11/06/19   Nita Sickle, MD  metroNIDAZOLE (FLAGYL) 500 MG tablet Take 1 tablet (500 mg total) by mouth 2 (two) times daily. 04/12/20   Loleta Rose, MD    Allergies Patient has no known allergies.  Family History  Problem Relation Age of Onset  . Heart disease Maternal Grandmother     Social History Social History   Tobacco Use  . Smoking status: Never Smoker  . Smokeless tobacco:  Never Used  Vaping Use  . Vaping Use: Never used  Substance Use Topics  . Alcohol use: Yes    Comment: occ  . Drug use: No     Review of Systems  Constitutional: No fever/chills Eyes: No visual changes. No discharge. ENT: Negative for congestion and rhinorrhea. Cardiovascular: No chest pain. Respiratory: Positive for cough. No SOB. Gastrointestinal: No abdominal pain.  No vomiting.  No diarrhea.  No constipation. Musculoskeletal: Positive for body aches. Skin: Negative for rash, abrasions, lacerations, ecchymosis. Neurological: Positive for headaches.   ____________________________________________   PHYSICAL EXAM:  VITAL SIGNS: ED Triage Vitals [08/14/20 1358]  Enc Vitals Group     BP      Pulse      Resp      Temp      Temp src      SpO2      Weight      Height      Head Circumference      Peak Flow      Pain Score 5     Pain Loc      Pain Edu?      Excl. in GC?      Constitutional: Alert and oriented. Well appearing and in no acute distress. Eyes: Conjunctivae are normal. PERRL. EOMI. No discharge. Head: Atraumatic. ENT: No frontal and maxillary sinus tenderness.      Ears: Tympanic membranes pearly gray with good landmarks. No discharge.      Nose:  No congestion/rhinnorhea.      Mouth/Throat: Mucous membranes are moist. Oropharynx non-erythematous. Tonsils not enlarged. No exudates. Uvula midline. Neck: No stridor.   Hematological/Lymphatic/Immunilogical: No cervical lymphadenopathy. Cardiovascular: Normal rate, regular rhythm.  Good peripheral circulation. Respiratory: Normal respiratory effort without tachypnea or retractions. Lungs CTAB. Good air entry to the bases with no decreased or absent breath sounds. Gastrointestinal: Bowel sounds 4 quadrants. Soft and nontender to palpation. No guarding or rigidity. No palpable masses. No distention. Musculoskeletal: Full range of motion to all extremities. No gross deformities appreciated. Neurologic:   Normal speech and language. No gross focal neurologic deficits are appreciated.  Skin:  Skin is warm, dry and intact. No rash noted. Psychiatric: Mood and affect are normal. Speech and behavior are normal. Patient exhibits appropriate insight and judgement.   ____________________________________________   LABS (all labs ordered are listed, but only abnormal results are displayed)  Labs Reviewed  SARS CORONAVIRUS 2 (TAT 6-24 HRS)   ____________________________________________  EKG   ____________________________________________  RADIOLOGY   No results found.  ____________________________________________    PROCEDURES  Procedure(s) performed:    Procedures    Medications - No data to display   ____________________________________________   INITIAL IMPRESSION / ASSESSMENT AND PLAN / ED COURSE  Pertinent labs & imaging results that were available during my care of the patient were reviewed by me and considered in my medical decision making (see chart for details).  Review of the [place] CSRS was performed in accordance of the [facility] prior to dispensing any controlled drugs.   Patient's diagnosis is consistent with suspected covid 19. Vital signs and exam are reassuring. Patient appears well and is staying well hydrated. Patient feels comfortable going home.  Patient is to follow up with PCP as needed or otherwise directed. Patient is given ED precautions to return to the ED for any worsening or new symptoms.  Suzanne Mathews was evaluated in Emergency Department on 08/14/2020 for the symptoms described in the history of present illness. She was evaluated in the context of the global COVID-19 pandemic, which necessitated consideration that the patient might be at risk for infection with the SARS-CoV-2 virus that causes COVID-19. Institutional protocols and algorithms that pertain to the evaluation of patients at risk for COVID-19 are in a state of rapid change based on  information released by regulatory bodies including the CDC and federal and state organizations. These policies and algorithms were followed during the patient's care in the ED.   ____________________________________________  FINAL CLINICAL IMPRESSION(S) / ED DIAGNOSES  Final diagnoses:  Suspected COVID-19 virus infection      NEW MEDICATIONS STARTED DURING THIS VISIT:  ED Discharge Orders    None          This chart was dictated using voice recognition software/Dragon. Despite best efforts to proofread, errors can occur which can change the meaning. Any change was purely unintentional.    Enid Derry, PA-C 08/14/20 1646    Willy Eddy, MD 08/15/20 1351

## 2020-08-14 NOTE — ED Triage Notes (Signed)
Pt HA, body aches and cough since 08/09/20

## 2020-08-15 LAB — SARS CORONAVIRUS 2 (TAT 6-24 HRS): SARS Coronavirus 2: POSITIVE — AB

## 2020-11-02 ENCOUNTER — Ambulatory Visit: Payer: Medicaid Other

## 2021-07-10 ENCOUNTER — Emergency Department
Admission: EM | Admit: 2021-07-10 | Discharge: 2021-07-10 | Disposition: A | Discharge status: Home / Self Care | Payer: Medicaid Other | Attending: Emergency Medicine | Admitting: Emergency Medicine

## 2021-07-10 ENCOUNTER — Other Ambulatory Visit: Payer: Self-pay

## 2021-07-10 DIAGNOSIS — B349 Viral infection, unspecified: Secondary | ICD-10-CM | POA: Insufficient documentation

## 2021-07-10 DIAGNOSIS — Z20822 Contact with and (suspected) exposure to covid-19: Secondary | ICD-10-CM | POA: Insufficient documentation

## 2021-07-10 LAB — RESP PANEL BY RT-PCR (FLU A&B, COVID) ARPGX2
Influenza A by PCR: NEGATIVE
Influenza B by PCR: NEGATIVE
SARS Coronavirus 2 by RT PCR: NEGATIVE

## 2021-07-10 NOTE — ED Triage Notes (Signed)
Pt states that she hasn't felt well in the past 2 days, states headache and congestion

## 2021-07-10 NOTE — Discharge Instructions (Addendum)
Please take Tylenol and ibuprofen/Advil for your pain.  It is safe to take them together, or to alternate them every few hours.  Take up to 1000mg  of Tylenol at a time, up to 4 times per day.  Do not take more than 4000 mg of Tylenol in 24 hours.  For ibuprofen, take 400-600 mg, 4-5 times per day.  Check your MyChart results for your influenza and COVID test results.

## 2021-07-10 NOTE — ED Provider Notes (Signed)
[facility] Emergency Department Provider Note ____________________________________________   Event Date/Time   First MD Initiated Contact with Patient 07/10/21 1344     (approximate)  I have reviewed the triage vital signs and the nursing notes.  HISTORY  Chief Complaint Headache   HPI Suzanne Mathews is a 25 y.o. femalewho presents to the ED for evaluation of headache and congestion.  Chart review indicates no relevant medical history.  Patient presents to the ED, accompanied by her girlfriend, for evaluation of 2-3 days of upper respiratory congestion, headache, diffuse myalgias, malaise and poor appetite.  She reports poor smell and taste, that she attributes to thick congestion and difficulty breathing through her nose.  Denies productive cough, chest pain, syncope, emesis or abdominal pain.   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 Authorizing Provider  cephALEXin (KEFLEX) 500 MG capsule Take 1 capsule (500 mg total) by mouth 2 (two) times daily. 04/12/20   Loleta Rose, MD  ibuprofen (ADVIL) 800 MG tablet Take 1 tablet (800 mg total) by mouth every 8 (eight) hours as needed. 11/06/19   Nita Sickle, MD  metroNIDAZOLE (FLAGYL) 500 MG tablet Take 1 tablet (500 mg total) by mouth 2 (two) times daily. 04/12/20   Loleta Rose, MD    Allergies Patient has no known allergies.  Family History  Problem Relation Age of Onset   Heart  disease Maternal Grandmother     Social History Social History   Tobacco Use   Smoking status: Never   Smokeless tobacco: Never  Vaping Use   Vaping Use: Never used  Substance Use Topics   Alcohol use: Yes    Comment: occ   Drug use: No    Review of Systems  Constitutional: No fever/chills Eyes: No visual changes. ENT: No sore throat. Positive for respiratory congestion and rhinorrhea Cardiovascular: Denies chest pain. Respiratory: Denies shortness of breath.  Positive for cough, nonproductive. Gastrointestinal: No abdominal pain.  No nausea, no vomiting.  No diarrhea.  No constipation. Genitourinary: Negative for dysuria. Musculoskeletal: Negative for back pain. Skin: Negative for rash. Neurological: Negative for headaches, focal weakness or numbness. ____________________________________________   PHYSICAL EXAM:  VITAL SIGNS: Vitals:   07/10/21 1207 07/10/21 1337  BP: 107/70 113/66  Pulse: 72 65  Resp: 16 17  Temp: 98.2 F (36.8 C)   SpO2: 95% 100%     Constitutional: Alert and oriented. Well appearing and in no acute distress. Eyes: Conjunctivae are normal. PERRL. EOMI. Head: Atraumatic. Nose: Clear congestion/rhinnorhea. Mouth/Throat: Mucous membranes are moist.  Oropharynx non-erythematous. Neck: No stridor. No cervical spine tenderness to palpation. Cardiovascular: Normal rate, regular rhythm. Grossly normal heart sounds.  Good peripheral circulation. Respiratory: Normal respiratory effort.  No retractions. Lungs CTAB. Gastrointestinal: Soft , nondistended, nontender to palpation. No CVA tenderness. Musculoskeletal: No lower extremity tenderness nor edema.  No joint effusions. No signs of acute trauma. Neurologic:  Normal speech and language. No gross focal  neurologic deficits are appreciated. No gait instability noted. Skin:  Skin is warm, dry and intact. No rash noted. Psychiatric: Mood and affect are normal. Speech and behavior are  normal. ____________________________________________   LABS (all labs ordered are listed, but only abnormal results are displayed)  Labs Reviewed  RESP PANEL BY RT-PCR (FLU A&B, COVID) ARPGX2   ____________________________________________  12 Lead EKG   ____________________________________________  RADIOLOGY  ED MD interpretation:    Official radiology report(s): No results found.  ____________________________________________   PROCEDURES and INTERVENTIONS  Procedure(s) performed (including Critical Care):  Procedures  Medications - No data to display  ____________________________________________   MDM / ED COURSE   Healthy 25 year old female presents to the ED with a couple days of a viral syndrome amenable to outpatient management.  Normal vitals on room air.  She looks clinically well.  Evidence of URI with congestion and rhinorrhea.  Clear lungs throughout and benign abdomen.  We will swab for COVID and flu and discharged with expectant management and return precautions for the ED.    ____________________________________________   FINAL CLINICAL IMPRESSION(S) / ED DIAGNOSES  Final diagnoses:  Viral syndrome     ED Discharge Orders     None        Kiffany Schelling   Note:  This document was prepared using Dragon voice recognition software and may include unintentional dictation errors.    Delton Prairie, MD 07/10/21 [phone]

## 2021-10-09 ENCOUNTER — Encounter: Payer: Self-pay | Admitting: Family Medicine

## 2021-10-09 ENCOUNTER — Ambulatory Visit (LOCAL_COMMUNITY_HEALTH_CENTER): Payer: Medicaid Other | Admitting: Family Medicine

## 2021-10-09 ENCOUNTER — Other Ambulatory Visit: Payer: Self-pay

## 2021-10-09 VITALS — BP 109/60 | Ht 63.0 in | Wt 153.4 lb

## 2021-10-09 DIAGNOSIS — Z113 Encounter for screening for infections with a predominantly sexual mode of transmission: Secondary | ICD-10-CM

## 2021-10-09 DIAGNOSIS — Z309 Encounter for contraceptive management, unspecified: Secondary | ICD-10-CM | POA: Diagnosis not present

## 2021-10-09 DIAGNOSIS — N760 Acute vaginitis: Secondary | ICD-10-CM

## 2021-10-09 DIAGNOSIS — B9689 Other specified bacterial agents as the cause of diseases classified elsewhere: Secondary | ICD-10-CM

## 2021-10-09 LAB — WET PREP FOR TRICH, YEAST, CLUE
Trichomonas Exam: NEGATIVE
Yeast Exam: NEGATIVE

## 2021-10-09 LAB — HM HIV SCREENING LAB: HM HIV Screening: NEGATIVE

## 2021-10-09 MED ORDER — METRONIDAZOLE 500 MG PO TABS: 500.0000 mg | ORAL_TABLET | Freq: Two times a day (BID) | ORAL | 0 refills | Status: AC

## 2021-10-09 NOTE — Progress Notes (Signed)
Wet prep reviewed with provider, and patient treated for BV per SO.Marland KitchenJenetta Downer, RN

## 2021-10-09 NOTE — Progress Notes (Signed)
[facility] DEPARTMENT [facility] [address] Main Number: [phone]  Family Planning Visit- Repeat Yearly Visit  Subjective:  Suzanne Mathews is a 26 y.o. G2P0020  being seen today for an annual wellness visit and to discuss contraception options.   The patient is currently using No Method - Other Reason for pregnancy prevention. Patient does not want a pregnancy in the next year. Patient has the following medical problems: has Pilonidal cyst with abscess; Pilonidal cyst; Peritonsillar abscess; and Adjustment disorder with mixed disturbance of emotions and conduct on their problem list.  Chief Complaint  Patient presents with   Annual Exam    STD screening    Patient reports she is here for PE today.  States that she doesn't need BCM d/t partner is female.  Patient denies concerns.  See flowsheet for other program required questions.   Body mass index is 27.17 kg/m. - Patient is eligible for diabetes screening based on BMI and age >57?  not applicable HA1C ordered? not applicable  Patient reports 1 of partners in last year. Desires STI screening?  Yes   Has patient been screened once for HCV in the past?  No  No results found for: HCVAB  Does the patient have current of drug use, have a partner with drug use, and/or has been incarcerated since last result? No  If yes-- Screen for HCV through [facility]   Does the patient meet criteria for HBV testing? No  Criteria:  -Household, sexual or needle sharing contact with HBV -History of drug use -HIV positive -Those with known Hep C   Health Maintenance Due  Topic Date Due   COVID-19 Vaccine (1) Never done   HPV VACCINES (1 - 2-dose series) Never done   Hepatitis C Screening  Never done   TETANUS/TDAP  Never done   INFLUENZA VACCINE  Never done   CHLAMYDIA SCREENING  04/12/2021    ROS  The following portions of the patient's history were reviewed and updated as  appropriate: allergies, current medications, past family history, past medical history, past social history, past surgical history and problem list. Problem list updated.  Objective:   Vitals:   10/09/21 1348  BP: 109/60  Weight: 153 lb 6.4 oz (69.6 kg)  Height: 5\' 3"  (1.6 m)    Physical Exam Constitutional:      Appearance: Normal appearance.  HENT:     Head: Normocephalic and atraumatic.  Pulmonary:     Effort: Pulmonary effort is normal.  Abdominal:     Palpations: Abdomen is soft.  Genitourinary:    Comments: Pelvic not indicated. Client self-collect wet prep and NAAT. Musculoskeletal:        General: Normal range of motion.  Skin:    General: Skin is warm and dry.  Neurological:     General: No focal deficit present.     Mental Status: She is alert.  Psychiatric:        Mood and Affect: Mood normal.        Behavior: Behavior normal.      Assessment and Plan:  Suzanne Mathews is a 26 y.o. female G2P0020 presenting to the [facility] Department for an yearly wellness and contraception visit  Contraception counseling: Reviewed all forms of birth control options in the tiered based approach. available including abstinence; over the counter/barrier methods; hormonal contraceptive medication including pill, patch, ring, injection,contraceptive implant, ECP; hormonal and nonhormonal IUDs; permanent sterilization options including vasectomy and  the various tubal sterilization modalities. Risks, benefits, and typical effectiveness rates were reviewed.  Questions were answered.  Written information was also given to the patient to review.  Patient desires no method, this was prescribed for patient. She will follow up in 01/2022 for pap for surveillance.    was told to call with any further questions, or with any concerns about this method of contraception.  Emphasized use of condoms 100% of the time for STI prevention   1. Screening examination for venereal  disease  - HIV [place] [facility] - Syphilis Serology, [place] [facility] - Chlamydia/Gonorrhea [place] [facility] - WET PREP FOR TRICH, YEAST, CLUE Treat wet prep as per SO    Return in about 3 months (around 01/10/2022) for  PAP.  No future appointments.  Larene Pickett, FNP

## 2021-10-09 NOTE — Progress Notes (Signed)
Patient here for PE and STD testing. Last Pap was 01/11/2019. Last PE was 04/22/2019. Marland KitchenBurt Knack, RN

## 2021-10-09 NOTE — Addendum Note (Signed)
Addended by: Burt Knack on: 10/09/2021 05:16 PM   Modules accepted: Orders

## 2023-05-21 ENCOUNTER — Telehealth: Payer: Self-pay

## 2023-05-21 NOTE — Telephone Encounter (Signed)
Patient left a message at 2:58pm that she was calling to make a appointment. Return patient call and left a message for call back. We do not have a referral for patient and patient has never been seen here at our office

## 2023-11-03 ENCOUNTER — Ambulatory Visit: Payer: Self-pay

## 2023-11-18 ENCOUNTER — Ambulatory Visit: Payer: Self-pay

## 2023-11-18 DIAGNOSIS — Z113 Encounter for screening for infections with a predominantly sexual mode of transmission: Secondary | ICD-10-CM

## 2023-11-18 LAB — WET PREP FOR TRICH, YEAST, CLUE
Trichomonas Exam: NEGATIVE
Yeast Exam: NEGATIVE

## 2023-11-18 NOTE — Progress Notes (Unsigned)
 [facility] Department STI clinic [address], [address] [place] [address] Main phone: [phone]  STI screening visit  Subjective:  Suzanne Mathews is a 28 y.o. female being seen today for an STI screening visit. The patient reports they do not have symptoms.  Patient reports that they do not desire a pregnancy in the next year.   They reported they are not interested in discussing contraception today.    Patient's last menstrual period was 11/18/2023 (exact date).  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 No  Last HIV test per patient/review of record was  Lab Results  Component Value Date   HMHIVSCREEN Negative - Validated 10/09/2021   No results found for: "HIV"   Last HEPC test per patient/review of record was No results found for: "HMHEPCSCREEN" No components found for: "HEPC"   Last HEPB test per patient/review of record was No components found for: "HMHEPBSCREEN"   Patient reports last pap was: 2020  No results found for: "DIAGPAP", "HPVHIGH", "ADEQPAP" No results found for: "SPECADGYN" Result Date Procedure Results Follow-ups  01/11/2019 HM PAP SMEAR HM Pap smear: Negative     Screening for MPX risk: Does the patient have an unexplained rash? No Is the patient MSM? No Does the patient endorse multiple sex partners or anonymous sex partners? No Did the patient have close or sexual contact with a person diagnosed with MPX? No Has the patient traveled outside the [place] where MPX is  endemic? No Is there a high clinical suspicion for MPX-- evidenced by one of the following No  -Unlikely to be chickenpox  -Lymphadenopathy  -Rash that present in same phase of evolution on any given body part See flowsheet for further details and programmatic requirements.   Immunization history:   There is no immunization history on file for this patient.   The following portions of the patient's history were reviewed and updated as appropriate: allergies, current medications, past medical history, past social history, past surgical history and problem list.  Objective:  There were no vitals filed for this visit.  Physical Exam Vitals and nursing note reviewed.  Constitutional:      Appearance: Normal appearance.  HENT:     Head: Normocephalic.     Mouth/Throat:     Mouth: Mucous membranes are moist.  Cardiovascular:     Rate and Rhythm: Normal rate.  Pulmonary:     Effort: Pulmonary effort is normal.  Abdominal:     Palpations: Abdomen is soft.  Genitourinary:    Comments: Declined genital exam- no symptoms, self swabbed Musculoskeletal:        General: Normal range of motion.  Lymphadenopathy:     Head:     Right side of head: No submandibular, preauricular or posterior auricular adenopathy.     Left side of head: No submandibular, preauricular or posterior auricular adenopathy.     Cervical: No cervical adenopathy.     Upper Body:     Right upper body: No supraclavicular or axillary adenopathy.  Left upper body: No supraclavicular or axillary adenopathy.  Skin:    General: Skin is warm and dry.  Neurological:     Mental Status: She is alert and oriented to person, place, and time.  Psychiatric:        Mood and Affect: Mood normal.     Assessment and Plan:  GELENA KLOSINSKI is a 28 y.o. female presenting to the [facility] Department for STI screening  1. Screening for venereal disease (Primary)  - Chlamydia/Gonorrhea [place] [facility] - Syphilis  Serology, [place] [facility] - WET PREP FOR TRICH, YEAST, CLUE   Patient accepted all screenings including  vaginal CT/GC and bloodwork for HIV/RPR, and wet prep. Patient meets criteria for HepB screening? No. Ordered? not applicable Patient meets criteria for HepC screening? No. Ordered? not applicable  Treat wet prep per standing order Discussed time line for [facility] results and that patient will be called with positive results and encouraged patient to call if she had not heard in 2 weeks.  Counseled to return or seek care for continued or worsening symptoms Recommended repeat testing in 3 months with positive results. Recommended condom use with all sex for STI prevention.   Patient is currently using  nothing- same sex partner  to prevent pregnancy.    No follow-ups on file.  No future appointments.  Lenice Llamas, [place]

## 2023-11-18 NOTE — Progress Notes (Unsigned)
 Here today for STD screening. Accepts bloodwork for Syphilis but declines HIV. Tawny Hopping, RN  Wet Prep negative. Declined condoms. Tawny Hopping, RN
